# Patient Record
Sex: Male | Born: 1970 | Race: White | Hispanic: No | Marital: Married | Smoking: Never smoker
Health system: Southern US, Community
[De-identification: ages and names within clinical notes are randomized; demographics above are authoritative.]

## PROBLEM LIST (undated history)

## (undated) DIAGNOSIS — K219 Gastro-esophageal reflux disease without esophagitis: Secondary | ICD-10-CM

## (undated) DIAGNOSIS — F419 Anxiety disorder, unspecified: Secondary | ICD-10-CM

## (undated) DIAGNOSIS — R142 Eructation: Secondary | ICD-10-CM

## (undated) DIAGNOSIS — G4730 Sleep apnea, unspecified: Secondary | ICD-10-CM

## (undated) DIAGNOSIS — R079 Chest pain, unspecified: Secondary | ICD-10-CM

## (undated) DIAGNOSIS — F329 Major depressive disorder, single episode, unspecified: Secondary | ICD-10-CM

## (undated) DIAGNOSIS — Z9989 Dependence on other enabling machines and devices: Secondary | ICD-10-CM

## (undated) DIAGNOSIS — R109 Unspecified abdominal pain: Secondary | ICD-10-CM

## (undated) DIAGNOSIS — G4733 Obstructive sleep apnea (adult) (pediatric): Secondary | ICD-10-CM

## (undated) DIAGNOSIS — R12 Heartburn: Secondary | ICD-10-CM

## (undated) DIAGNOSIS — R140 Abdominal distension (gaseous): Secondary | ICD-10-CM

## (undated) DIAGNOSIS — Z9109 Other allergy status, other than to drugs and biological substances: Secondary | ICD-10-CM

## (undated) DIAGNOSIS — K649 Unspecified hemorrhoids: Secondary | ICD-10-CM

## (undated) DIAGNOSIS — G43909 Migraine, unspecified, not intractable, without status migrainosus: Secondary | ICD-10-CM

## (undated) DIAGNOSIS — T7840XA Allergy, unspecified, initial encounter: Secondary | ICD-10-CM

## (undated) DIAGNOSIS — F32A Depression, unspecified: Secondary | ICD-10-CM

## (undated) DIAGNOSIS — R197 Diarrhea, unspecified: Secondary | ICD-10-CM

## (undated) HISTORY — DX: Obstructive sleep apnea (adult) (pediatric): G47.33

## (undated) HISTORY — DX: Abdominal distension (gaseous): R14.0

## (undated) HISTORY — DX: Depression, unspecified: F32.A

## (undated) HISTORY — DX: Dependence on other enabling machines and devices: Z99.89

## (undated) HISTORY — DX: Migraine, unspecified, not intractable, without status migrainosus: G43.909

## (undated) HISTORY — DX: Chest pain, unspecified: R07.9

## (undated) HISTORY — DX: Unspecified abdominal pain: R10.9

## (undated) HISTORY — DX: Major depressive disorder, single episode, unspecified: F32.9

## (undated) HISTORY — DX: Heartburn: R12

## (undated) HISTORY — DX: Other allergy status, other than to drugs and biological substances: Z91.09

## (undated) HISTORY — DX: Sleep apnea, unspecified: G47.30

## (undated) HISTORY — DX: Unspecified hemorrhoids: K64.9

## (undated) HISTORY — DX: Allergy, unspecified, initial encounter: T78.40XA

## (undated) HISTORY — DX: Anxiety disorder, unspecified: F41.9

## (undated) HISTORY — DX: Diarrhea, unspecified: R19.7

## (undated) HISTORY — DX: Eructation: R14.2

## (undated) HISTORY — PX: OTHER SURGICAL HISTORY: SHX169

## (undated) HISTORY — DX: Gastro-esophageal reflux disease without esophagitis: K21.9

---
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
## 1999-03-26 ENCOUNTER — Encounter: Admission: RE | Admit: 1999-03-26 | Discharge: 1999-03-26 | Payer: Self-pay | Admitting: Allergy and Immunology

## 1999-03-26 ENCOUNTER — Encounter: Payer: Self-pay | Admitting: Allergy and Immunology

## 2002-01-27 ENCOUNTER — Encounter: Payer: Self-pay | Admitting: Internal Medicine

## 2002-01-27 ENCOUNTER — Encounter: Admission: RE | Admit: 2002-01-27 | Discharge: 2002-01-27 | Payer: Self-pay | Admitting: Internal Medicine

## 2004-06-09 HISTORY — PX: CARDIAC CATHETERIZATION: SHX172

## 2005-04-02 ENCOUNTER — Observation Stay (HOSPITAL_COMMUNITY): Admission: EM | Admit: 2005-04-02 | Discharge: 2005-04-03 | Payer: Self-pay | Admitting: Emergency Medicine

## 2005-04-08 ENCOUNTER — Ambulatory Visit (HOSPITAL_BASED_OUTPATIENT_CLINIC_OR_DEPARTMENT_OTHER): Admission: RE | Admit: 2005-04-08 | Discharge: 2005-04-08 | Payer: Self-pay | Admitting: Internal Medicine

## 2005-04-13 ENCOUNTER — Ambulatory Visit: Payer: Self-pay | Admitting: Internal Medicine

## 2005-07-21 ENCOUNTER — Encounter: Admission: RE | Admit: 2005-07-21 | Discharge: 2005-07-21 | Payer: Self-pay | Admitting: Internal Medicine

## 2006-06-09 HISTORY — PX: CARPAL TUNNEL RELEASE: SHX101

## 2006-07-17 ENCOUNTER — Ambulatory Visit (HOSPITAL_BASED_OUTPATIENT_CLINIC_OR_DEPARTMENT_OTHER): Admission: RE | Admit: 2006-07-17 | Discharge: 2006-07-17 | Payer: Self-pay | Admitting: Orthopedic Surgery

## 2007-04-19 ENCOUNTER — Ambulatory Visit: Payer: Self-pay | Admitting: Pulmonary Disease

## 2007-04-19 ENCOUNTER — Ambulatory Visit: Admission: RE | Admit: 2007-04-19 | Discharge: 2007-04-19 | Payer: Self-pay | Admitting: Pulmonary Disease

## 2007-04-28 ENCOUNTER — Telehealth (INDEPENDENT_AMBULATORY_CARE_PROVIDER_SITE_OTHER): Payer: Self-pay | Admitting: *Deleted

## 2007-04-30 DIAGNOSIS — J309 Allergic rhinitis, unspecified: Secondary | ICD-10-CM | POA: Insufficient documentation

## 2007-04-30 DIAGNOSIS — E785 Hyperlipidemia, unspecified: Secondary | ICD-10-CM | POA: Insufficient documentation

## 2007-04-30 DIAGNOSIS — G4733 Obstructive sleep apnea (adult) (pediatric): Secondary | ICD-10-CM | POA: Insufficient documentation

## 2007-04-30 DIAGNOSIS — G4710 Hypersomnia, unspecified: Secondary | ICD-10-CM | POA: Insufficient documentation

## 2007-04-30 DIAGNOSIS — R0602 Shortness of breath: Secondary | ICD-10-CM | POA: Insufficient documentation

## 2007-04-30 DIAGNOSIS — R519 Headache, unspecified: Secondary | ICD-10-CM | POA: Insufficient documentation

## 2007-04-30 DIAGNOSIS — R51 Headache: Secondary | ICD-10-CM | POA: Insufficient documentation

## 2007-05-03 ENCOUNTER — Ambulatory Visit: Payer: Self-pay | Admitting: Pulmonary Disease

## 2007-05-03 ENCOUNTER — Encounter: Payer: Self-pay | Admitting: Pulmonary Disease

## 2010-07-09 NOTE — Assessment & Plan Note (Signed)
   Vital Signs:  Patient Profile:   40 Years Old Male Weight:      327.50 pounds O2 Sat:      96 % Temp:     98.2 degrees F oral Pulse rate:   96 / minute BP sitting:   134 / 82  (right arm)  Vitals Entered By: Cyndia Diver (May 03, 2007 9:16 AM) Oxygen therapy Room Air                 Chief Complaint:  routine f-u/denied any new complaints.  History of Present Illness: pt returns for f/u of cough, upper airway sx.  He is much improved on  two times a day ppi and veramyst.  His cxr was unremarkable and pfts without abn except on fvl.  Pt had mild truncation of inspiratory limb on fvl.  Current Allergies: No known allergies       Physical Exam  General:     normal appearance and obese.      Impression & Recommendations:  Problem # 1:  ALLERGIC RHINITIS (ICD-477.9)  His updated medication list for this problem includes:    Zyrtec Allergy 10 Mg Tabs (Cetirizine hcl) .Marland Kitchen... Take by mouth once daily    Veramyst 27.5 Mcg/spray Susp (Fluticasone furoate) ..... Inhale 2 sprays in each nostril once daily pt is much improved on the above  regimen.  He should continue on this.  Problem # 2:  DYSPNEA (ICD-786.05) much better.  He has been to gym and able to walk on treadmill.  I suspect the truncation seen on fvl is secondary to ua dysfunction assoc with pnd and lpr. If he has persistent sx will need ua eval. per ent to r/o structural lesion.  I think lpr is playing a role here, but asked pt to decrease prilosec to one once daily for now.  f/u will be on as needed basis.  Medications Added to Medication List This Visit: 1)  Prilosec Otc 20 Mg Tbec (Omeprazole magnesium) .... Take two tabs by mouth  two times a day 2)  Veramyst 27.5 Mcg/spray Susp (Fluticasone furoate) .... Inhale 2 sprays in each nostril once daily     ]

## 2010-07-09 NOTE — Progress Notes (Signed)
  spoke with pt's family member.  family member stated pt already had appt scheduled for monday 05-03-07 @ 9:15 ---- Converted from flag ---- ---- 04/28/2007 2:05 PM, Barbaraann Share MD wrote: pt needs ov to discuss pfts and to see how things are going ------------------------------

## 2010-10-25 NOTE — Op Note (Signed)
NAME:  KONG, PACKETT                ACCOUNT NO.:  [id number]   MEDICAL RECORD NO.:  [id number]          PATIENT TYPE:  AMB   LOCATION:  DSC                          FACILITY:  [facility]   PHYSICIAN:  Katy Fitch. Sypher, M.D. DATE OF BIRTH:  12-Oct-1970   DATE OF PROCEDURE:  07/17/2006  DATE OF DISCHARGE:                               OPERATIVE REPORT   PREOPERATIVE DIAGNOSIS:  Bilateral carpal tunnel syndrome with positive  electrodiagnostic studies.   POSTOPERATIVE DIAGNOSIS:  Bilateral carpal tunnel syndrome with positive  electrodiagnostic studies.   OPERATION:  1. One release of left transverse carpal ligament.  2. Injection of right ulnar bursa.   OPERATING SURGEON:  Katy Fitch. Sypher, M.D.   ASSISTANT:  Jonni Sanger, P.A.   ANESTHESIA:  General by LMA, supervising anesthesiologist Dr.  Sampson Goon.   INDICATIONS:  Martin [name] is a 40 year old gentleman referred for  evaluation and management of bilateral hand numbness.  Clinical  examination revealed signs of probable carpal tunnel syndrome.  Electrodiagnostic studies completed by Laurier Nancy, M.D.  revealed significant bilateral carpal tunnel syndrome.   Due to a failure to respond to nonoperative measures, he is now brought  to the operating room for release of his left transverse carpal ligament  and for palliation will be injected into the right ulnar bursa pending  right carpal tunnel release surgery.   PROCEDURE:  Keenan Trefry was brought to the operating room and placed in  the supine position on the operating table.  Following induction of  general anesthesia by LMA technique, he was being stabilized when it was  noted that he may have had some leakage around his LMA.  Therefore, Dr.  Sampson Goon returned to the room and placed Mr. Ibarra under general  endotracheal anesthesia.   After his anesthesia needs were stabilized, the right arm and left arm  were prepped with Betadine soap and solution and  sterilely draped.   Following exsanguination of the left arm with an Esmarch bandage, the  arterial tourniquet on the proximal brachium was inflated to 250 mmHg.  The procedure on the left commenced with a short incision in the line of  ring finger in the palm.  The subcutaneous tissues were carefully  divided to reveal palmar fascia.  This was split longitudinally to  reveal the  common sensory branch of the median nerve.  These were  followed back to the transverse carpal ligament, which was gently  isolated from median nerve.  The transverse carpal ligament was released  along its ulnar border extending into the distal forearm.  This widely  opened the carpal canal.  The volar forearm fascia was likewise released  subcutaneously.   The contents of the carpal canal inspected.  No mass or other  predicaments were noted.  The wound was then repaired with intradermal 3-  0 Prolene suture.   A compressive dressing was applied with a volar plaster splint on the  left.  The tourniquet was released with immediate capillary refill to  the fingers and thumb.   Attention was then directed to the right hand.  The fingers were placed  in flexion and a 27-gauge needle was placed into the ulnar bursa ulnar  to the position of the median nerve.  A mixture of 1.5 mL of 1%  lidocaine and 1 mL of Depo-Medrol 40 mg/mL was injected into the ulnar  bursa without complication.  This was then treated with direct  compression for a few moments.   For aftercare Mr. Pulsifer is advised to elevate his left hand.  He will  use his hand for self-care.   He will return to [place] for follow-up in 1 week or sooner for any problems.   He is provided a prescription for Percocet 5 mg one p.o. q.4-6h. p.r.n.  pain, 20 tablets without refill.      Katy Fitch Sypher, M.D.  Electronically Signed     RVS/MEDQ  D:  07/17/2006  T:  07/17/2006  Job:  [id number]

## 2010-10-25 NOTE — Consult Note (Signed)
NAMERILAN, EILAND NO.:  [id number]   MEDICAL RECORD NO.:  [id number]          PATIENT TYPE:  EMS   LOCATION:  MAJO                         FACILITY:  [facility]   PHYSICIAN:  Meade Maw, M.D.    DATE OF BIRTH:  1971-01-29   DATE OF CONSULTATION:  04/02/2005  DATE OF DISCHARGE:                                   CONSULTATION   REASON FOR CONSULTATION:  Chest pain.   HISTORY:  Martin [name] is a very pleasant 40 year old male who presents with  intermittent episodes since Monday.  The chest pain is brought on with  exertion, described as a bandlike pain around his chest, left more than  right.  This is associated with dizziness and nausea.  There has been no  emesis, no diaphoresis  The chest pain will persist until he stops  his  activity.  He has not had any prior episodes of chest pain similar to this.  He does admit that he has panic attacks and GERD this pain is not similar to  his previous chest pain.  His coronary risk factors include male sex, strong  family history, sedentary lifestyle.  There is no history of tobacco,  diabetes or hypertension.   PAST MEDICAL HISTORY:  1.  Depression.  2.  Low back pain.  3.  Asthma.  4.  Allergies.  5.  Probable sleep apnea.   PAST SURGICAL HISTORY:  1.  Pilonidal cyst removal in 1988.  2.  Appendectomy in 1979.   CURRENT MEDICATIONS:  1.  Paxil 40 mg daily.  2.  Prilosec 10 mg daily.   ALLERGIES:  NO KNOWN DRUG ALLERGIES.   INPATIENT MEDICATIONS:  1.  Paxil 40 mg daily.  2.  Prilosec 10 mg daily.  3.  Nexium 40 mg daily.  4.  Aspirin 81 mg daily.  5.  Sublingual nitroglycerin p.r.n.   FAMILY HISTORY:  Mother is alive and well at 44 years old.  Father had  myocardial infarction at 37 years old.  Maternal grandfather with myocardial  infarction in his 32s.  He has two half siblings who had similar health  problems.   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  He has  a 90-year-old child.  Lives with his wife.  No history of  tobacco use.  Rare history of alcohol use.   REVIEW OF SYSTEMS:  He knows he has increased fatigue.  His wife notes that  he snores.  He has been poorly compliant with his diet.  He notes that he  has gained approximately 100 pounds since his high school years.  Review of  systems is otherwise negative.   PHYSICAL EXAMINATION:  GENERAL APPEARANCE:  A middle-age male in no acute  distress.  Currently pain-free.  VITAL SIGNS:  His weight in the office is 293 pounds, afebrile.  Blood  pressure is 130/80, heart rate 72.  HEENT:  Unremarkable.  NECK:  He has good carotid upstrokes, no carotid bruits.  PULMONARY:  Breath sounds are equal and clear to auscultation.  No use  of  accessory muscles.  CARDIOVASCULAR:  Normal S1, normal S2, regular rate and rhythm, no murmurs,  rubs, or gallops noted.  ABDOMEN:  Soft, benign and nontender.  EXTREMITIES:  No peripheral edema.  SKIN:  Warm and dry.  NEUROLOGIC:  Nonfocal.   ECG performed reveals a normal sinus rhythm, normal ECG.   LABORATORY DATA:  Currently pending.   IMPRESSION:  1.  Chest pain concerning for angina in a 40 year old male with risk factors      as noted above.  In that his angina is typical with exertion, will      proceed directly to left heart catheterization.  Risks, benefits, and      options were discussed with the patient.  He wishes to proceed with left      heart catheterization.  He will be started on Lovenox and aspirin at 325      mg daily.  2.  Depression.  He will continue with Zoloft at 40 mg daily.  3.  Gastroesophageal reflux disease.  He will continue with Prilosec at 10      mg daily.  4.  Probable sleep apnea.  Will schedule him for a sleep study in the      outpatient basis.      Meade Maw, M.D.  Electronically Signed     HP/MEDQ  D:  04/02/2005  T:  04/03/2005  Job:  [id number]   cc:   Candyce Churn, M.D.  Fax: [phone]

## 2010-10-25 NOTE — Procedures (Signed)
NAME:  Martin [name], Martin [name]                ACCOUNT NO.:  [id number]   MEDICAL RECORD NO.:  [id number]          PATIENT TYPE:  OUT   LOCATION:  SLEEP CENTER                 [facility]AC[facility]   PHYSICIAN:  Clinton D. Maple Hudson, M.D. DATE OF BIRTH:  05-Oct-1970   DATE OF STUDY:  04/08/2005                              NOCTURNAL POLYSOMNOGRAM   REFERRING PHYSICIAN:  Dr. Ladell Pier.   DATE OF STUDY:  April 08, 2005.   INDICATION FOR STUDY:  Hypersomnia with sleep apnea.   EPWORTH SLEEPINESS SCORE:  11/24.   BMI:  43.   WEIGHT:  288 pounds.   Home medications citalopram 40 milligrams daily.   SLEEP ARCHITECTURE:  Total sleep time 347 minutes with sleep efficiency 68%.  Stage I 12%, stage II 70%, stages III and IV 15%, REM 3% of total sleep  time. Sleep latency 48 minutes, REM latency 292 minutes, awake after sleep  onset 122 minutes, arousal index increased at 50. No bedtime medication  reported.   RESPIRATORY DATA:  Split study protocol. Apnea/hypopnea index (AHI, RDI)  150.4 obstructive events per hour indicating very severe obstructive sleep  apnea/hypopnea syndrome before C-PAP. This included 238 obstructive apneas,  7 central apneas and 17 hypopneas before C-PAP. Events were not positional.  REM AHI 0 per hour. C-PAP titration did not lead to clear control of events.  The technician referred to severe nasal congestion which contributed to  difficulty with C-PAP titration. 13 CWP was maintained for 21 minutes  holding an apnea/hypopnea index of 0. A full-face ultra mirage mask was used  with heated humidifier.   OXYGEN DATA:  Moderate to loud snoring with oxygen desaturation to a nadir  of 73% before C-PAP. After C-PAP control, saturation held 92-96% during best  C-PAP interval.   CARDIAC DATA:  Normal sinus rhythm.   MOVEMENT/PARASOMNIA:  Occasional leg jerk with little effect on sleep.   IMPRESSION/RECOMMENDATIONS:  1.  Very severe obstructive sleep apnea/hypopnea syndrome,  AHI 150.4 per      hour with moderate to loud snoring and oxygen desaturation to 73%.  2.  Significant nasal congestion which limited ability to titrate C-PAP.  3.  C-PAP titration to recommended initial trial at 213 CWP, AHI 0 per hour.      A large full-face ultra mirage mask was used with heated humidifier.      Clinton D. Maple Hudson, M.D.  Diplomate, Biomedical engineer of Sleep Medicine  Electronically Signed     CDY/MEDQ  D:  04/13/2005 14:59:18  T:  04/14/2005 00:50:46  Job:  [id number]

## 2010-10-25 NOTE — H&P (Signed)
Martin [name], Martin [name]                ACCOUNT NO.:  [id number]   MEDICAL RECORD NO.:  [id number]          PATIENT TYPE:  INP   LOCATION:  2012                         FACILITY:  [facility]   PHYSICIAN:  Candyce Churn, M.D.DATE OF BIRTH:  Nov 10, 1970   DATE OF ADMISSION:  04/02/2005  DATE OF DISCHARGE:                                HISTORY & PHYSICAL   This is a 40 year old, obese, white male with a chief complaint of  exertional band-like chest pain, left greater than right side, associated  with dizziness and nausea, no sweats.  Episodes lasting 1-5 minutes and  stopping if he stops the activity.  He does have a family history of heart  disease in his paternal father and father.  The patient has a history of  elevated cholesterol.  No exercise routinely and no prudent diet.   HISTORY OF PRESENT ILLNESS:  For two days he has had exertional crescendo  chest pain that is band-like across the chest associated with dizziness and  nausea but not sweats.  The pain radiates to the left arm and hand and lasts  1-5 minutes.  The pain goes away when he stops his exertional activity.   PAST MEDICAL HISTORY:  1.  Depression.  2.  Back pain.  3.  Asthma.  4.  Allergies.  5.  Sleep abnormality that sounds like sleep apnea.   PAST SURGICAL HISTORY:  1.  Pilonidal cyst in 1988.  2.  Appendectomy in 1979.   He has no known drug allergies.   MEDICATIONS:  1.  Citalopram 40 mg one a day.  2.  Prilosec 10 mg one a day.   FAMILY HISTORY:  Mother is alive and well at age 37.  His father had a heart  attack but is still alive at age 10.  His paternal grandfather died of a  heart attack in his 22s.  He has a half brother and half sister with no  health problems.   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  He does not smoke.  He drinks occasional alcohol.   PHYSICAL EXAMINATION:  GENERAL APPEARANCE:  Alert and no distress.  VITAL SIGNS:   Weight 292 pounds, temperature 99.2, pulse 72, blood pressure  130/80.  HEENT:  Normocephalic, atraumatic.  CHEST:  Lungs are clear.  HEART:  Regular rate and rhythm without murmurs, gallops, rubs, clicks.  No  S3 or S4 with normal S1 and S2.  ABDOMEN:  Benign.  EXTREMITIES:  No clubbing, cyanosis, or edema.  SKIN:  Intact.  GU:  Normal penis.  RECTAL:  Deferred.   LABORATORY:  EKG unremarkable.   IMPRESSION:  Chest pain rule out myocardial infarction.   PLAN:  Admit to rule out MI.  __________  consult with Dr. Candyce Churn.      Lavinia Sharps, N.P.      Candyce Churn, M.D.  Electronically Signed    MAP/MEDQ  D:  04/02/2005  T:  04/02/2005  Job:  [id number]

## 2010-10-25 NOTE — Discharge Summary (Signed)
Martin [name], Martin [name] NO.:  [id number]   MEDICAL RECORD NO.:  [id number]          PATIENT TYPE:  INP   LOCATION:  2012                         FACILITY:  [facility]   PHYSICIAN:  Candyce Churn, M.D.DATE OF BIRTH:  [birth year]   DATE OF ADMISSION:  04/02/2005  DATE OF DISCHARGE:  04/03/2005                                 DISCHARGE SUMMARY   DISCHARGE DIAGNOSIS:  1.  Chest pain, non-cardiac.  2.  Depression.  3.  History of apnea.  4.  Asthma.  5.  Allergies.  6.  Sleep abnormality that is possibly sleep apnea.   DISCHARGE MEDICATIONS:  1.  Citalopram 40 mg daily.  2.  Prilosec 10 mg daily.   CONSULTATIONS:  Meade Maw, M.D., cardiology   PROCEDURES:  Cardiac catheterization performed on April 02, 2005, findings  revealed normal coronary arteries and an ejection fraction of 60-65%.   DISPOSITION:  The patient was discharged home after a 24 hour admission with  chest pain resolved not felt to be cardiac.  It sounds like it could  possibly be secondary to GERD.  He will be discharged home on Protonix 40 mg  daily and be followed up back in the office in 1-2 weeks.   DISCHARGE LABORATORY DATA:  From April 03, 2005, white count 8,100,  hemoglobin 15.4, platelet count 250,000, with normal differential.  PT 13.2  seconds, PTT 30 seconds.  Electrolytes on October 26 showed sodium 140,  potassium 3.7, chloride 106, bicarb 29, glucose 113, BUN 14, creatinine 1.3,  calcium 9.3.  LFTs were normal on April 02, 2005.  CK on admission was 300  with CK MB 2.5, relative index 0.8, and troponin less than 0.01.  Subsequent  CKs were 242 and 211 respectively at 8 hour intervals after the initial CK.  The troponins remained negative at 0.01 x 3.   HOSPITAL COURSE:  The patient was admitted on April 02, 2005, complaining  of chest pain that was band like in nature and exertional, left greater than  right, associated with dizziness and nausea, but no sweat.  The  episode  would last 1-5 minutes and would go away if he stopped physical activity.  He had a very strong family history of heart disease in his paternal father  - father had a heart attack but is still alive at age 49, maternal father  had heart  attack in 109s.  He is a relatively inactive male with no prudent diet.  He  also has a history of elevated cholesterol.  The patient's cardiac  catheterization was within normal limits and he was discharged home for  further follow up in the office to further assess chest pain, but it is felt  this was likely secondary to GERD.      Candyce Churn, M.D.  Electronically Signed     RNG/MEDQ  D:  05/29/2005  T:  05/30/2005  Job:  [id number]

## 2010-10-25 NOTE — Cardiovascular Report (Signed)
NAMEMENACHEM, [name] NO.:  [id number]   MEDICAL RECORD NO.:  [id number]          PATIENT TYPE:  INP   LOCATION:  2012                         FACILITY:  [facility]   PHYSICIAN:  Meade Maw, M.D.    DATE OF BIRTH:  [birth year]   DATE OF PROCEDURE:  04/03/2005  DATE OF DISCHARGE:                              CARDIAC CATHETERIZATION   REFERRING PHYSICIAN:  Johnella Moloney, M.D.   INDICATIONS FOR PROCEDURE:  Chest pain consistent with angina.   PROCEDURE:  After obtaining written informed consent, the patient was  brought to the cardiac catheterization lab in a post-absorptive state.  Preop sedation was achieved using Versed 10 mg IV.  Zofran 4 mg IV was given  for nausea.  The right groin was prepped and draped in the usual sterile  fashion.  Local anesthesia was achieved using 1% Xylocaine.  A 6-French  hemostasis sheath was placed into the right femoral artery using the  modified Seldinger technique.  Selective coronary angiography was performed  using a JL-3.5 and a JR4.  Multiple views were obtained.  All catheter  exchanges were made over a guidewire.  Single-plane ventriculogram was  performed in the RAO position using a 6-French pigtail curved catheter.   FINDINGS:  The aortic pressure is 108/18.  LV pressure was 106/78.  The EDP  is 20.   SINGLE-PLANE VENTRICULOGRAM:  Single-plane ventriculogram revealed normal  wall motion.  Ejection fraction of 60% to 65% was noted.  There was no  significant mitral regurgitation noted.   CORONARY ANGIOGRAPHY:  The left main coronary artery bifurcates into the  left anterior descending and circumflex vessel.  Left main coronary artery  was long.  There was no disease noted in the left main coronary artery.   Left anterior descending:  The left anterior descending gives rise to a  small D1, moderate D2 and goes on to end as an apical branch.  There is no  disease noted in the left anterior descending or its branches.   Circumflex vessel:  The circumflex vessel is a moderate-size vessel that  gives rise to a trivial OM-1, large OM-2 and ends as a posterolateral  branch.  There is no disease noted in circumflex or its branches.   Right coronary artery:  The right coronary artery is a large dominant artery  and also provides circulation to the apical region.  It gives rise to 2 RV  marginals, a moderate PDA and a large PL branch.  There is no disease noted  in the right coronary artery or its branches.   FINAL IMPRESSION:  1.  Normal coronary angiography.  2.  Normal single-plane ventriculogram.   RECOMMENDATIONS:  Consider other etiologies for his chest pain.      Meade Maw, M.D.  Electronically Signed     HP/MEDQ  D:  04/03/2005  T:  04/03/2005  Job:  [id number]

## 2011-03-25 ENCOUNTER — Encounter: Payer: Self-pay | Admitting: Family Medicine

## 2011-03-26 ENCOUNTER — Encounter: Payer: Self-pay | Admitting: Family Medicine

## 2011-03-26 ENCOUNTER — Ambulatory Visit (INDEPENDENT_AMBULATORY_CARE_PROVIDER_SITE_OTHER): Payer: BC Managed Care – PPO | Admitting: Family Medicine

## 2011-03-26 DIAGNOSIS — J302 Other seasonal allergic rhinitis: Secondary | ICD-10-CM

## 2011-03-26 DIAGNOSIS — Z23 Encounter for immunization: Secondary | ICD-10-CM

## 2011-03-26 DIAGNOSIS — G4730 Sleep apnea, unspecified: Secondary | ICD-10-CM

## 2011-03-26 DIAGNOSIS — J309 Allergic rhinitis, unspecified: Secondary | ICD-10-CM

## 2011-03-26 DIAGNOSIS — Z9109 Other allergy status, other than to drugs and biological substances: Secondary | ICD-10-CM

## 2011-03-26 DIAGNOSIS — F418 Other specified anxiety disorders: Secondary | ICD-10-CM | POA: Insufficient documentation

## 2011-03-26 DIAGNOSIS — F341 Dysthymic disorder: Secondary | ICD-10-CM

## 2011-03-26 DIAGNOSIS — G4733 Obstructive sleep apnea (adult) (pediatric): Secondary | ICD-10-CM

## 2011-03-26 MED ORDER — TRIAMCINOLONE ACETONIDE(NASAL) 55 MCG/ACT NA INHA: 2.0000 | Freq: Every day | NASAL | Status: DC

## 2011-03-26 MED ORDER — MONTELUKAST SODIUM 10 MG PO TABS: 10.0000 mg | ORAL_TABLET | Freq: Every day | ORAL | Status: DC

## 2011-03-26 NOTE — Assessment & Plan Note (Signed)
con't zyrtec Add singulair and nasocort Refer to pulm

## 2011-03-26 NOTE — Progress Notes (Signed)
  Subjective:    Patient ID: Martin [name], male    DOB: 1971-06-07, 40 y.o.   MRN: [id number]  HPI Pt here to establish.  He is having increasing problems with allergies --he takes zyrtec daily and benadryl prn.  He is unable to be outside for any length of time without having his allergies act up.  He also c/o difficulty concentrating for a few month.  He is unable to finish tasks or focus on anything or even finish a conversation without changing the subject.      Review of Systems    as above Objective:   Physical Exam  Constitutional: He is oriented to person, place, and time. He appears well-developed and well-nourished.  HENT:  Head: Normocephalic and atraumatic.  Right Ear: External ear normal.  Left Ear: External ear normal.  Nose: Nose normal.  Mouth/Throat: Oropharynx is clear and moist. No oropharyngeal exudate.  Neck: Normal range of motion. Neck supple.  Cardiovascular: Normal rate, regular rhythm and normal heart sounds.   No murmur heard. Pulmonary/Chest: Effort normal and breath sounds normal. No respiratory distress. He has no wheezes. He has no rales.  Lymphadenopathy:    He has no cervical adenopathy.  Neurological: He is alert and oriented to person, place, and time.  Psychiatric: He has a normal mood and affect. His behavior is normal. Judgment and thought content normal.          Assessment & Plan:

## 2011-03-26 NOTE — Assessment & Plan Note (Addendum)
Refer to pulm for f/u and eval con't cpap

## 2011-03-26 NOTE — Patient Instructions (Signed)
Allergies, Generic Allergies may happen from anything your body is sensitive to. This may be food, medicines, pollens, chemicals, and nearly anything around you in everyday life that produces allergens. An allergen is anything that causes an allergy producing substance. Heredity is often a factor in causing these problems. This means you may have some of the same allergies as your parents. Food allergies happen in all age groups. Food allergies are some of the most severe and life threatening. Some common food allergies are cow's milk, seafood, eggs, nuts, wheat, and soybeans. SYMPTOMS  Swelling around the mouth.   An itchy red rash or hives.   Vomiting or diarrhea.   Difficulty breathing.  SEVERE ALLERGIC REACTIONS ARE LIFE-THREATENING.  This reaction is called anaphylaxis. It can cause the mouth and throat to swell and cause difficulty with breathing and swallowing. In severe reactions only a trace amount of food (for example, peanut oil in a salad) may cause death within seconds. Seasonal allergies occur in all age groups. These are seasonal because they usually occur during the same season every year. They may be a reaction to molds, grass pollens, or tree pollens. Other causes of problems are house dust mite allergens, pet dander, and mold spores. The symptoms often consist of nasal congestion, a runny itchy nose associated with sneezing, and tearing itchy eyes. There is often an associated itching of the mouth and ears. The problems happen when you come in contact with pollens and other allergens. Allergens are the particles in the air that the body reacts to with an allergic reaction. This causes you to release allergic antibodies. Through a chain of events, these eventually cause you to release histamine into the blood stream. Although it is meant to be protective to the body, it is this release that causes your discomfort. This is why you were given anti-histamines to feel better. If you are  unable to pinpoint the offending allergen, it may be determined by skin or blood testing. Allergies cannot be cured but can be controlled with medicine. Hay fever is a collection of all or some of the seasonal allergy problems. It may often be treated with simple over-the-counter medicine such as diphenhydramine. Take medicine as directed. Do not drink alcohol or drive while taking this medicine. Check with your caregiver or package insert for child dosages. If these medicines are not effective, there are many new medicines your caregiver can prescribe. Stronger medicine such as nasal spray, eye drops, and corticosteroids may be used if the first things you try do not work well. Other treatments such as immunotherapy or desensitizing injections can be used if all else fails. Follow up with your caregiver if problems continue. These seasonal allergies are usually not life threatening. They are generally more of a nuisance that can often be handled using medicine. HOME CARE INSTRUCTIONS  If unsure what causes a reaction, keep a diary of foods eaten and symptoms that follow. Avoid foods that cause reactions.   If hives or rash are present:   Take medicine as directed.   You may use an over-the-counter antihistamine (diphenhydramine) for hives and itching as needed.   Apply cold compresses (cloths) to the skin or take baths in cool water. Avoid hot baths or showers. Heat will make a rash and itching worse.   If you are severely allergic:   Following a treatment for a severe reaction, hospitalization is often required for closer follow-up.   Wear a medic-alert bracelet or necklace stating the allergy.     You and your family must learn how to give adrenaline or use an anaphylaxis kit.   If you have had a severe reaction, always carry your anaphylaxis kit or EpiPen with you. Use this medicine as directed by your caregiver if a severe reaction is occurring. Failure to do so could have a fatal outcome.   SEE YOUR CAREGIVER IF:  You suspect a food allergy. Symptoms generally happen within 30 minutes of eating a food.   Your symptoms have not gone away within 2 days or are getting worse.   You develop new symptoms.   You want to retest yourself or your child with a food or drink you think causes an allergic reaction. Never do this if an anaphylactic reaction to that food or drink has happened before. Only do this under the care of a caregiver.  SEEK IMMEDIATE MEDICAL CARE IF:  You have difficulty breathing, are wheezing, or have a tight feeling in your chest or throat.   You have a swollen mouth, or you have hives, swelling, or itching all over your body.   You have had a severe reaction that has responded to your anaphylaxis kit or an EpiPen. These reactions may return when the medicine has worn off. These reactions should be considered life threatening.  MAKE SURE YOU:   Understand these instructions.   Will watch your condition.   Will get help right away if you are not doing well or get worse.  Document Released: 08/19/2002 Document Re-Released: 06/17/2009 [employer] Patient Information 2011 [employer]. 

## 2011-03-26 NOTE — Assessment & Plan Note (Signed)
con't celexa  ?

## 2011-03-28 ENCOUNTER — Telehealth: Payer: Self-pay | Admitting: *Deleted

## 2011-03-28 NOTE — Telephone Encounter (Signed)
Prior Auth approved 03-26-11 until 12-19-13, Pharmacy notified via fax, approval letter scan to chart.

## 2011-04-18 ENCOUNTER — Institutional Professional Consult (permissible substitution): Payer: BC Managed Care – PPO | Admitting: Pulmonary Disease

## 2011-07-04 ENCOUNTER — Other Ambulatory Visit: Payer: Self-pay | Admitting: Family Medicine

## 2011-07-04 DIAGNOSIS — Z9109 Other allergy status, other than to drugs and biological substances: Secondary | ICD-10-CM

## 2011-07-04 MED ORDER — MONTELUKAST SODIUM 10 MG PO TABS: 10.0000 mg | ORAL_TABLET | Freq: Every day | ORAL | Status: DC

## 2011-07-04 NOTE — Telephone Encounter (Signed)
Faxed.   KP 

## 2011-09-10 ENCOUNTER — Telehealth: Payer: Self-pay | Admitting: Family Medicine

## 2011-09-10 MED ORDER — CITALOPRAM HYDROBROMIDE 40 MG PO TABS: 40.0000 mg | ORAL_TABLET | Freq: Every day | ORAL | Status: DC

## 2011-09-10 NOTE — Telephone Encounter (Signed)
Rx Faxed and VM has been left making the patient aware.     KP

## 2011-09-10 NOTE — Telephone Encounter (Signed)
Patient states he is overdue for CPE. He scheduled first available on 11-07-11. He needs refills on his citalopram to get him by until his appt. 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 Laury [name] will be out of the office during that time. Patient wanted to make Dr. Laury [name] aware of the situation.

## 2011-10-10 ENCOUNTER — Ambulatory Visit: Payer: BC Managed Care – PPO | Admitting: Family Medicine

## 2011-10-23 ENCOUNTER — Other Ambulatory Visit: Payer: Self-pay | Admitting: Family Medicine

## 2011-11-07 ENCOUNTER — Encounter: Payer: BC Managed Care – PPO | Admitting: Family Medicine

## 2011-11-14 ENCOUNTER — Encounter: Payer: Self-pay | Admitting: Family Medicine

## 2011-11-14 ENCOUNTER — Ambulatory Visit (INDEPENDENT_AMBULATORY_CARE_PROVIDER_SITE_OTHER): Payer: BC Managed Care – PPO | Admitting: Family Medicine

## 2011-11-14 VITALS — BP 117/74 | HR 96 | Temp 99.1°F | Ht 68.0 in | Wt 356.2 lb

## 2011-11-14 DIAGNOSIS — F329 Major depressive disorder, single episode, unspecified: Secondary | ICD-10-CM

## 2011-11-14 DIAGNOSIS — J309 Allergic rhinitis, unspecified: Secondary | ICD-10-CM

## 2011-11-14 DIAGNOSIS — J302 Other seasonal allergic rhinitis: Secondary | ICD-10-CM

## 2011-11-14 DIAGNOSIS — Z0000 Encounter for general adult medical examination without abnormal findings: Secondary | ICD-10-CM

## 2011-11-14 DIAGNOSIS — F32A Depression, unspecified: Secondary | ICD-10-CM

## 2011-11-14 DIAGNOSIS — E785 Hyperlipidemia, unspecified: Secondary | ICD-10-CM

## 2011-11-14 MED ORDER — CITALOPRAM HYDROBROMIDE 40 MG PO TABS: 40.0000 mg | ORAL_TABLET | Freq: Every day | ORAL | Status: DC

## 2011-11-14 MED ORDER — AZELASTINE-FLUTICASONE 137-50 MCG/ACT NA SUSP: 1.0000 | Freq: Two times a day (BID) | NASAL | Status: DC

## 2011-11-14 NOTE — Assessment & Plan Note (Signed)
Con't diet and exercise  

## 2011-11-14 NOTE — Assessment & Plan Note (Signed)
Check labs 

## 2011-11-14 NOTE — Patient Instructions (Signed)
Preventive Care for Adults, Male A healthy lifestyle and preventative care can promote health and wellness. Preventative health guidelines for men include the following key practices:  A routine yearly physical is a good way to check with your caregiver about your health and preventative screening. It is a chance to share any concerns and updates on your health, and to receive a thorough exam.   Visit your dentist for a routine exam and preventative care every 6 months. Brush your teeth twice a day and floss once a day. Good oral hygiene prevents tooth decay and gum disease.   The frequency of eye exams is based on your age, health, family medical history, use of contact lenses, and other factors. Follow your caregiver's recommendations for frequency of eye exams.   Eat a healthy diet. Foods like vegetables, fruits, whole grains, low-fat dairy products, and lean protein foods contain the nutrients you need without too many calories. Decrease your intake of foods high in solid fats, added sugars, and salt. Eat the right amount of calories for you.Get information about a proper diet from your caregiver, if necessary.   Regular physical exercise is one of the most important things you can do for your health. Most adults should get at least 150 minutes of moderate-intensity exercise (any activity that increases your heart rate and causes you to sweat) each week. In addition, most adults need muscle-strengthening exercises on 2 or more days a week.   Maintain a healthy weight. The body mass index (BMI) is a screening tool to identify possible weight problems. It provides an estimate of body fat based on height and weight. Your caregiver can help determine your BMI, and can help you achieve or maintain a healthy weight.For adults 20 years and older:   A BMI below 18.5 is considered underweight.   A BMI of 18.5 to 24.9 is normal.   A BMI of 25 to 29.9 is considered overweight.   A BMI of 30 and above  is considered obese.   Maintain normal blood lipids and cholesterol levels by exercising and minimizing your intake of saturated fat. Eat a balanced diet with plenty of fruit and vegetables. Blood tests for lipids and cholesterol should begin at age 20 and be repeated every 5 years. If your lipid or cholesterol levels are high, you are over 50, or you are a high risk for heart disease, you may need your cholesterol levels checked more frequently.Ongoing high lipid and cholesterol levels should be treated with medicines if diet and exercise are not effective.   If you smoke, find out from your caregiver how to quit. If you do not use tobacco, do not start.   If you choose to drink alcohol, do not exceed 2 drinks per day. One drink is considered to be 12 ounces (355 mL) of beer, 5 ounces (148 mL) of wine, or 1.5 ounces (44 mL) of liquor.   Avoid use of street drugs. Do not share needles with anyone. Ask for help if you need support or instructions about stopping the use of drugs.   High blood pressure causes heart disease and increases the risk of stroke. Your blood pressure should be checked at least every 1 to 2 years. Ongoing high blood pressure should be treated with medicines, if weight loss and exercise are not effective.   If you are 45 to 42 years old, ask your caregiver if you should take aspirin to prevent heart disease.   Diabetes screening involves taking a blood   sample to check your fasting blood sugar level. This should be done once every 3 years, after age 45, if you are within normal weight and without risk factors for diabetes. Testing should be considered at a younger age or be carried out more frequently if you are overweight and have at least 1 risk factor for diabetes.   Colorectal cancer can be detected and often prevented. Most routine colorectal cancer screening begins at the age of 50 and continues through age 75. However, your caregiver may recommend screening at an earlier  age if you have risk factors for colon cancer. On a yearly basis, your caregiver may provide home test kits to check for hidden blood in the stool. Use of a small camera at the end of a tube, to directly examine the colon (sigmoidoscopy or colonoscopy), can detect the earliest forms of colorectal cancer. Talk to your caregiver about this at age 50, when routine screening begins. Direct examination of the colon should be repeated every 5 to 10 years through age 75, unless early forms of pre-cancerous polyps or small growths are found.   Hepatitis C blood testing is recommended for all people born from 1945 through 1965 and any individual with known risks for hepatitis C.   Practice safe sex. Use condoms and avoid high-risk sexual practices to reduce the spread of sexually transmitted infections (STIs). STIs include gonorrhea, chlamydia, syphilis, trichomonas, herpes, HPV, and human immunodeficiency virus (HIV). Herpes, HIV, and HPV are viral illnesses that have no cure. They can result in disability, cancer, and death.   A one-time screening for abdominal aortic aneurysm (AAA) and surgical repair of large AAAs by sound wave imaging (ultrasonography) is recommended for ages 65 to 75 years who are current or former smokers.   Healthy men should no longer receive prostate-specific antigen (PSA) blood tests as part of routine cancer screening. Consult with your caregiver about prostate cancer screening.   Testicular cancer screening is not recommended for adult males who have no symptoms. Screening includes self-exam, caregiver exam, and other screening tests. Consult with your caregiver about any symptoms you have or any concerns you have about testicular cancer.   Use sunscreen with skin protection factor (SPF) of 30 or more. Apply sunscreen liberally and repeatedly throughout the day. You should seek shade when your shadow is shorter than you. Protect yourself by wearing long sleeves, pants, a  wide-brimmed hat, and sunglasses year round, whenever you are outdoors.   Once a month, do a whole body skin exam, using a mirror to look at the skin on your back. Notify your caregiver of new moles, moles that have irregular borders, moles that are larger than a pencil eraser, or moles that have changed in shape or color.   Stay current with required immunizations.   Influenza. You need a dose every fall (or winter). The composition of the flu vaccine changes each year, so being vaccinated once is not enough.   Pneumococcal polysaccharide. You need 1 to 2 doses if you smoke cigarettes or if you have certain chronic medical conditions. You need 1 dose at age 65 (or older) if you have never been vaccinated.   Tetanus, diphtheria, pertussis (Tdap, Td). Get 1 dose of Tdap vaccine if you are younger than age 65 years, are over 65 and have contact with an infant, are a healthcare worker, or simply want to be protected from whooping cough. After that, you need a Td booster dose every 10 years. Consult your caregiver if   you have not had at least 3 tetanus and diphtheria-containing shots sometime in your life or have a deep or dirty wound.   HPV. This vaccine is recommended for males 13 through 42 years of age. This vaccine may be given to men 22 through 42 years of age who have not completed the 3 dose series. It is recommended for men through age 26 who have sex with men or whose immune system is weakened because of HIV infection, other illness, or medications. The vaccine is given in 3 doses over 6 months.   Measles, mumps, rubella (MMR). You need at least 1 dose of MMR if you were born in 1957 or later. You may also need a 2nd dose.   Meningococcal. If you are age 19 to 21 years and a first-year college student living in a residence hall, or have one of several medical conditions, you need to get vaccinated against meningococcal disease. You may also need additional booster doses.   Zoster (shingles).  If you are age 60 years or older, you should get this vaccine.   Varicella (chickenpox). If you have never had chickenpox or you were vaccinated but received only 1 dose, talk to your caregiver to find out if you need this vaccine.   Hepatitis A. You need this vaccine if you have a specific risk factor for hepatitis A virus infection, or you simply wish to be protected from this disease. The vaccine is usually given as 2 doses, 6 to 18 months apart.   Hepatitis B. You need this vaccine if you have a specific risk factor for hepatitis B virus infection or you simply wish to be protected from this disease. The vaccine is given in 3 doses, usually over 6 months.  Preventative Service / Frequency Ages 19 to 39  Blood pressure check.** / Every 1 to 2 years.   Lipid and cholesterol check.** / Every 5 years beginning at age 20.   Hepatitis C blood test.** / For any individual with known risks for hepatitis C.   Skin self-exam. / Monthly.   Influenza immunization.** / Every year.   Pneumococcal polysaccharide immunization.** / 1 to 2 doses if you smoke cigarettes or if you have certain chronic medical conditions.   Tetanus, diphtheria, pertussis (Tdap,Td) immunization. / A one-time dose of Tdap vaccine. After that, you need a Td booster dose every 10 years.   HPV immunization. / 3 doses over 6 months, if 26 and younger.   Measles, mumps, rubella (MMR) immunization. / You need at least 1 dose of MMR if you were born in 1957 or later. You may also need a 2nd dose.   Meningococcal immunization. / 1 dose if you are age 19 to 21 years and a first-year college student living in a residence hall, or have one of several medical conditions, you need to get vaccinated against meningococcal disease. You may also need additional booster doses.   Varicella immunization.** / Consult your caregiver.   Hepatitis A immunization.** / Consult your caregiver. 2 doses, 6 to 18 months apart.   Hepatitis B  immunization.** / Consult your caregiver. 3 doses usually over 6 months.  Ages 40 to 64  Blood pressure check.** / Every 1 to 2 years.   Lipid and cholesterol check.** / Every 5 years beginning at age 20.   Fecal occult blood test (FOBT) of stool. / Every year beginning at age 50 and continuing until age 75. You may not have to do this test if   you get colonoscopy every 10 years.   Flexible sigmoidoscopy** or colonoscopy.** / Every 5 years for a flexible sigmoidoscopy or every 10 years for a colonoscopy beginning at age 50 and continuing until age 75.   Hepatitis C blood test.** / For all people born from 1945 through 1965 and any individual with known risks for hepatitis C.   Skin self-exam. / Monthly.   Influenza immunization.** / Every year.   Pneumococcal polysaccharide immunization.** / 1 to 2 doses if you smoke cigarettes or if you have certain chronic medical conditions.   Tetanus, diphtheria, pertussis (Tdap/Td) immunization.** / A one-time dose of Tdap vaccine. After that, you need a Td booster dose every 10 years.   Measles, mumps, rubella (MMR) immunization. / You need at least 1 dose of MMR if you were born in 1957 or later. You may also need a 2nd dose.   Varicella immunization.**/ Consult your caregiver.   Meningococcal immunization.** / Consult your caregiver.   Hepatitis A immunization.** / Consult your caregiver. 2 doses, 6 to 18 months apart.   Hepatitis B immunization.** / Consult your caregiver. 3 doses, usually over 6 months.  Ages 65 and over  Blood pressure check.** / Every 1 to 2 years.   Lipid and cholesterol check.**/ Every 5 years beginning at age 20.   Fecal occult blood test (FOBT) of stool. / Every year beginning at age 50 and continuing until age 75. You may not have to do this test if you get colonoscopy every 10 years.   Flexible sigmoidoscopy** or colonoscopy.** / Every 5 years for a flexible sigmoidoscopy or every 10 years for a colonoscopy  beginning at age 50 and continuing until age 75.   Hepatitis C blood test.** / For all people born from 1945 through 1965 and any individual with known risks for hepatitis C.   Abdominal aortic aneurysm (AAA) screening.** / A one-time screening for ages 65 to 75 years who are current or former smokers.   Skin self-exam. / Monthly.   Influenza immunization.** / Every year.   Pneumococcal polysaccharide immunization.** / 1 dose at age 65 (or older) if you have never been vaccinated.   Tetanus, diphtheria, pertussis (Tdap, Td) immunization. / A one-time dose of Tdap vaccine if you are over 65 and have contact with an infant, are a healthcare worker, or simply want to be protected from whooping cough. After that, you need a Td booster dose every 10 years.   Varicella immunization. ** / Consult your caregiver.   Meningococcal immunization.** / Consult your caregiver.   Hepatitis A immunization. ** / Consult your caregiver. 2 doses, 6 to 18 months apart.   Hepatitis B immunization.** / Check with your caregiver. 3 doses, usually over 6 months.  **Family history and personal history of risk and conditions may change your caregiver's recommendations. Document Released: 07/22/2001 Document Revised: 05/15/2011 Document Reviewed: 10/21/2010 [employer] Patient Information 2012 [employer]. 

## 2011-11-14 NOTE — Progress Notes (Signed)
Subjective:    Patient ID: Martin [name], male    DOB: 1971-04-25, 41 y.o.   MRN: [id number]  HPI Pt here for cpe and labs.  No complaints.  Review of Systems Review of Systems  Constitutional: Negative for activity change, appetite change and fatigue.  HENT: Negative for hearing loss, congestion, tinnitus and ear discharge.  dentist q42m Eyes: Negative for visual disturbance (see optho q1y -- vision corrected to 20/20 with glasses).  Respiratory: Negative for cough, chest tightness and shortness of breath.   Cardiovascular: Negative for chest pain, palpitations and leg swelling.  Gastrointestinal: Negative for abdominal pain, diarrhea, constipation and abdominal distention.  Genitourinary: Negative for urgency, frequency, decreased urine volume and difficulty urinating.  Musculoskeletal: Negative for back pain, arthralgias and gait problem.  Skin: Negative for color change, pallor and rash.  Neurological: Negative for dizziness, light-headedness, numbness and headaches.  Hematological: Negative for adenopathy. Does not bruise/bleed easily.  Psychiatric/Behavioral: Negative for suicidal ideas, confusion, sleep disturbance, self-injury, dysphoric mood, decreased concentration and agitation.  Family History  Problem Relation Age of Onset  . Hypertension Father   . Heart disease Father     pacemaker  . Hypertension      All 4 Grandparents  . Arthritis Maternal Grandmother   . Breast cancer Maternal Grandmother   . Lung cancer Paternal Grandmother   . Heart disease Paternal Grandfather   . Stroke Maternal Grandfather    History  Substance Use Topics  . Smoking status: Never Smoker   . Smokeless tobacco: Never Used  . Alcohol Use: Yes     Holidays only   Past Medical History  Diagnosis Date  . Asthma   . Depression   . Migraines   . Environmental allergies   . GERD (gastroesophageal reflux disease)   . Anxiety   . Sleep apnea     on CPAP  . Allergy   . Sleep apnea   .  OSA on CPAP          Objective:   Physical Exam  BP 117/74  Pulse 96  Temp(Src) 99.1 F (37.3 C) (Oral)  Ht 5\' 8"  (1.727 m)  Wt 356 lb 3.2 oz (161.571 kg)  BMI 54.16 kg/m2  SpO2 98%  General Appearance:    Alert, cooperative, no distress, appears stated age  Head:    Normocephalic, without obvious abnormality, atraumatic  Eyes:    PERRL, conjunctiva/corneas clear, EOM's intact, fundi    benign, both eyes       Ears:    Normal TM's and external ear canals, both ears  Nose:   Nares normal, septum midline, mucosa normal, no drainage   or sinus tenderness  Throat:   Lips, mucosa, and tongue normal; teeth and gums normal  Neck:   Supple, symmetrical, trachea midline, no adenopathy;       thyroid:  No enlargement/tenderness/nodules; no carotid   bruit or JVD  Back:     Symmetric, no curvature, ROM normal, no CVA tenderness  Lungs:     Clear to auscultation bilaterally, respirations unlabored  Chest wall:    No tenderness or deformity  Heart:    Regular rate and rhythm, S1 and S2 normal, no murmur, rub   or gallop  Abdomen:     Soft, non-tender, bowel sounds active all four quadrants,    no masses, no organomegaly  Genitalia:    Pt refused  Rectal:   pt refused  Extremities:   Extremities normal, atraumatic, no cyanosis or edema  Pulses:   2+ and symmetric all extremities  Skin:   Skin color, texture, turgor normal, no rashes or lesions  Lymph nodes:   Cervical, supraclavicular, and axillary nodes normal  Neurologic:   CNII-XII intact. Normal strength, sensation and reflexes      throughout        Assessment & Plan:  cpe-- check labs           ghm utd

## 2011-12-05 ENCOUNTER — Other Ambulatory Visit (INDEPENDENT_AMBULATORY_CARE_PROVIDER_SITE_OTHER): Payer: BC Managed Care – PPO

## 2011-12-05 DIAGNOSIS — Z0000 Encounter for general adult medical examination without abnormal findings: Secondary | ICD-10-CM

## 2011-12-05 LAB — BASIC METABOLIC PANEL
BUN: 11 mg/dL (ref 6–23)
CO2: 29 mEq/L (ref 19–32)
Calcium: 9.3 mg/dL (ref 8.4–10.5)
Chloride: 107 mEq/L (ref 96–112)
Creatinine, Ser: 1 mg/dL (ref 0.4–1.5)
GFR: 84 mL/min (ref >60.00)
Glucose, Bld: 89 mg/dL (ref 70–99)
Potassium: 4.3 mEq/L (ref 3.5–5.1)
Sodium: 141 mEq/L (ref 135–145)

## 2011-12-05 LAB — CBC WITH DIFFERENTIAL/PLATELET
Basophils Absolute: 0 10*3/uL (ref 0.0–0.1)
Basophils Relative: 0.8 % (ref 0.0–3.0)
Eosinophils Absolute: 0.2 10*3/uL (ref 0.0–0.7)
Eosinophils Relative: 3.2 % (ref 0.0–5.0)
HCT: 44.5 % (ref 39.0–52.0)
Hemoglobin: 14.9 g/dL (ref 13.0–17.0)
Lymphocytes Relative: 38.1 % (ref 12.0–46.0)
Lymphs Abs: 2 10*3/uL (ref 0.7–4.0)
MCHC: 33.6 g/dL (ref 30.0–36.0)
MCV: 86.4 fl (ref 78.0–100.0)
Monocytes Absolute: 0.4 10*3/uL (ref 0.1–1.0)
Monocytes Relative: 7.1 % (ref 3.0–12.0)
Neutro Abs: 2.6 10*3/uL (ref 1.4–7.7)
Neutrophils Relative %: 50.8 % (ref 43.0–77.0)
Platelets: 207 10*3/uL (ref 150.0–400.0)
RBC: 5.16 Mil/uL (ref 4.22–5.81)
RDW: 14.7 % — ABNORMAL HIGH (ref 11.5–14.6)
WBC: 5.2 10*3/uL (ref 4.5–10.5)

## 2011-12-05 LAB — TSH: TSH: 1.48 u[IU]/mL (ref 0.35–5.50)

## 2011-12-05 LAB — MICROALBUMIN / CREATININE URINE RATIO
Creatinine,U: 226.3 mg/dL
Microalb Creat Ratio: 0.6 mg/g (ref 0.0–30.0)
Microalb, Ur: 1.3 mg/dL (ref 0.0–1.9)

## 2011-12-05 LAB — LIPID PANEL
Cholesterol: 230 mg/dL — ABNORMAL HIGH (ref 0–200)
HDL: 28.8 mg/dL — ABNORMAL LOW (ref >39.00)
Total CHOL/HDL Ratio: 8
Triglycerides: 365 mg/dL — ABNORMAL HIGH (ref 0.0–149.0)
VLDL: 73 mg/dL — ABNORMAL HIGH (ref 0.0–40.0)

## 2011-12-05 LAB — LDL CHOLESTEROL, DIRECT: Direct LDL: 119.6 mg/dL

## 2011-12-05 LAB — HEPATIC FUNCTION PANEL
ALT: 30 U/L (ref 0–53)
AST: 22 U/L (ref 0–37)
Albumin: 3.8 g/dL (ref 3.5–5.2)
Alkaline Phosphatase: 55 U/L (ref 39–117)
Bilirubin, Direct: 0.1 mg/dL (ref 0.0–0.3)
Total Bilirubin: 0.6 mg/dL (ref 0.3–1.2)
Total Protein: 6.6 g/dL (ref 6.0–8.3)

## 2011-12-05 LAB — PSA: PSA: 0.28 ng/mL (ref 0.10–4.00)

## 2011-12-05 NOTE — Progress Notes (Signed)
Labs only

## 2011-12-12 ENCOUNTER — Other Ambulatory Visit: Payer: BC Managed Care – PPO

## 2011-12-12 LAB — FECAL OCCULT BLOOD, IMMUNOCHEMICAL: Fecal Occult Bld: NEGATIVE

## 2012-01-14 ENCOUNTER — Ambulatory Visit (INDEPENDENT_AMBULATORY_CARE_PROVIDER_SITE_OTHER): Payer: BC Managed Care – PPO | Admitting: Family Medicine

## 2012-01-14 ENCOUNTER — Encounter: Payer: Self-pay | Admitting: Family Medicine

## 2012-01-14 VITALS — BP 124/80 | HR 83 | Temp 98.7°F | Wt 348.8 lb

## 2012-01-14 DIAGNOSIS — J329 Chronic sinusitis, unspecified: Secondary | ICD-10-CM

## 2012-01-14 MED ORDER — CEFUROXIME AXETIL 500 MG PO TABS: 500.0000 mg | ORAL_TABLET | Freq: Two times a day (BID) | ORAL | Status: AC

## 2012-01-14 MED ORDER — PREDNISONE 10 MG PO TABS: ORAL_TABLET | ORAL | Status: DC

## 2012-01-14 NOTE — Progress Notes (Signed)
  Subjective:     Martin [name] is a 41 y.o. male who presents for evaluation of sinus pain. Symptoms include: congestion, facial pain, headaches, nasal congestion and sinus pressure. Onset of symptoms was 2 days ago. Symptoms have been gradually worsening since that time. Past history is significant for no history of pneumonia or bronchitis. Patient is a non-smoker.  The following portions of the patient's history were reviewed and updated as appropriate: allergies, current medications, past family history, past medical history, past social history, past surgical history and problem list.  Review of Systems Pertinent items are noted in HPI.   Objective:    BP 124/80  Pulse 83  Temp 98.7 F (37.1 C) (Oral)  Wt 348 lb 12.8 oz (158.215 kg)  SpO2 96% General appearance: alert, cooperative, appears stated age and mild distress Ears: normal TM's and external ear canals both ears Nose: green discharge, moderate congestion, sinus tenderness bilateral Throat: lips, mucosa, and tongue normal; teeth and gums normal Neck: mild anterior cervical adenopathy, supple, symmetrical, trachea midline and thyroid not enlarged, symmetric, no tenderness/mass/nodules Lungs: clear to auscultation bilaterally Lymph nodes: Cervical adenopathy: mild b/l    Assessment:    Acute bacterial sinusitis.    Plan:    Nasal steroids per medication orders. Antihistamines per medication orders. Ceftin per medication orders. pred taper

## 2012-01-14 NOTE — Patient Instructions (Signed)

## 2012-01-19 ENCOUNTER — Other Ambulatory Visit: Payer: Self-pay | Admitting: Family Medicine

## 2012-02-17 ENCOUNTER — Other Ambulatory Visit: Payer: Self-pay | Admitting: Obstetrics and Gynecology

## 2012-02-17 ENCOUNTER — Ambulatory Visit: Payer: BC Managed Care – PPO | Admitting: Family

## 2012-02-17 ENCOUNTER — Telehealth: Payer: Self-pay | Admitting: Family Medicine

## 2012-02-17 DIAGNOSIS — R109 Unspecified abdominal pain: Secondary | ICD-10-CM

## 2012-02-17 NOTE — Telephone Encounter (Signed)
Pt and wife walked into the office stating the pt needed to be seen and no one has called them back regarding an appt. I advised pt that we do not have any available appts here, and offered him a 3:30p appt with Sandford Craze. Pt's wife insisted that we make the pt an appt at the [facility] location. I advised her that we do not normally schedule with that office. Patient initially declined appt at [facility], but decided to make it and will call to cancel if he changes his mind and goes to urgent care.

## 2012-02-17 NOTE — Telephone Encounter (Signed)
Spoke with patient and he is at the urgent care at this time. I apologized for the inconvenience and he voiced understanding    KP

## 2012-02-17 NOTE — Telephone Encounter (Signed)
For MD to review      KP 

## 2012-02-17 NOTE — Telephone Encounter (Signed)
Pt could have been seen here-- its quick visit

## 2012-02-17 NOTE — Telephone Encounter (Signed)
Caller: Martin [name]/Patient; PCP: Lelon Perla.; CB#: [phone]; Call regarding Urinary Pain/Bleeding.  Onset of flank pain 02/14/12 along with urinary symptoms including pain on urination, frequency, urgency.  Temp/tactile.  Denies blood in urine.  Per flank pain protocol, advised being seen iwthin 4 hours; info to office for staff review/management for appointment need per office protocol.   May reach patient at [phone].

## 2012-02-18 ENCOUNTER — Ambulatory Visit
Admission: RE | Admit: 2012-02-18 | Discharge: 2012-02-18 | Disposition: A | Discharge status: Home / Self Care | Payer: BC Managed Care – PPO | Source: Ambulatory Visit | Attending: Obstetrics and Gynecology | Admitting: Obstetrics and Gynecology

## 2012-02-18 DIAGNOSIS — R109 Unspecified abdominal pain: Secondary | ICD-10-CM

## 2012-02-18 MED ORDER — IOHEXOL 300 MG/ML  SOLN
125.0000 mL | Freq: Once | INTRAMUSCULAR | Status: AC | PRN
  Administered 2012-02-18: 125 mL via INTRAVENOUS

## 2012-04-27 ENCOUNTER — Other Ambulatory Visit: Payer: Self-pay | Admitting: Family Medicine

## 2012-04-27 NOTE — Telephone Encounter (Signed)
Rx sent.    MW 

## 2012-04-28 NOTE — Telephone Encounter (Signed)
Refused Rx, sent yesterday Rx request dup.      MW

## 2012-05-29 ENCOUNTER — Other Ambulatory Visit: Payer: Self-pay | Admitting: Family Medicine

## 2012-05-29 DIAGNOSIS — M1990 Unspecified osteoarthritis, unspecified site: Secondary | ICD-10-CM

## 2012-05-31 ENCOUNTER — Other Ambulatory Visit: Payer: Self-pay | Admitting: Family Medicine

## 2012-05-31 NOTE — Telephone Encounter (Signed)
Refill for Celexa sent to pharmacy 

## 2012-05-31 NOTE — Telephone Encounter (Signed)
Refill done.  

## 2012-07-14 ENCOUNTER — Telehealth: Payer: Self-pay | Admitting: Family Medicine

## 2012-07-14 NOTE — Telephone Encounter (Signed)
Patient is going to Urgent Care (FYI)

## 2012-07-14 NOTE — Telephone Encounter (Signed)
Patient Information:  Caller Name: Ch[place]ke  Phone: [phone]  Patient: Martin, [name]  Gender: Male  DOB: [birth year]  Age: 42 Years  PCP: Lelon Perla.  Office Follow Up:  Does the office need to follow up with this patient?: No  Instructions For The Office: N/A   Symptoms  Reason For Call & Symptoms: sore throat, mild cough and sinus drainage.  pt also reports a headache.  Pt is not sure if he has a temp, he does have the chills  Reviewed Health History In EMR: Yes  Reviewed Medications In EMR: Yes  Reviewed Allergies In EMR: Yes  Reviewed Surgeries / Procedures: Yes  Date of Onset of Symptoms: 07/12/2012  Treatments Tried: benadryl  Treatments Tried Worked: No  Guideline(s) Used:  Sinus Pain and Congestion  Disposition Per Guideline:   See Today in Office  Reason For Disposition Reached:   Sinus pain (not just congestion) and fever  Advice Given:  For a Stuffy Nose - Use Nasal Washes:  Introduction: Saline (salt water) nasal irrigation (nasal wash) is an effective and simple home remedy for treating stuffy nose and sinus congestion. The nose can be irrigated by pouring, spraying, or squirting salt water into the nose and then letting it run back out.  How it Helps: The salt water rinses out excess mucus, washes out any irritants (dust, allergens) that might be present, and moistens the nasal cavity.  Pain and Fever Medicines:  For pain or fever relief, take either acetaminophen or ibuprofen.  They are over-the-counter (OTC) drugs that help treat both fever and pain. You can buy them at the drugstore.  Hydration:  Drink plenty of liquids (6-8 glasses of water daily). If the air in your home is dry, use a cool mist humidifier  Call Back If:   You become worse.  Patient Refused Recommendation:  Patient Will Go To [facility]  No appt available today; pt did not want to wait to be seen tomorrow.  Pt is going to go to UC

## 2012-07-31 ENCOUNTER — Other Ambulatory Visit: Payer: Self-pay | Admitting: Internal Medicine

## 2012-08-02 NOTE — Telephone Encounter (Signed)
Refill done.  

## 2012-09-14 ENCOUNTER — Other Ambulatory Visit: Payer: Self-pay | Admitting: Family Medicine

## 2012-10-12 ENCOUNTER — Other Ambulatory Visit: Payer: Self-pay | Admitting: Family Medicine

## 2012-10-15 ENCOUNTER — Ambulatory Visit (INDEPENDENT_AMBULATORY_CARE_PROVIDER_SITE_OTHER): Payer: BC Managed Care – PPO | Admitting: Family Medicine

## 2012-10-15 ENCOUNTER — Telehealth: Payer: Self-pay | Admitting: Family Medicine

## 2012-10-15 ENCOUNTER — Encounter: Payer: Self-pay | Admitting: Family Medicine

## 2012-10-15 VITALS — BP 118/90 | HR 88 | Temp 98.7°F | Ht 68.0 in | Wt 352.6 lb

## 2012-10-15 DIAGNOSIS — J0100 Acute maxillary sinusitis, unspecified: Secondary | ICD-10-CM

## 2012-10-15 MED ORDER — AMOXICILLIN 875 MG PO TABS: 875.0000 mg | ORAL_TABLET | Freq: Two times a day (BID) | ORAL | Status: DC

## 2012-10-15 MED ORDER — FLUTICASONE PROPIONATE 50 MCG/ACT NA SUSP: 2.0000 | Freq: Every day | NASAL | Status: DC

## 2012-10-15 NOTE — Telephone Encounter (Signed)
Noted pt has an appt with Tabori.

## 2012-10-15 NOTE — Progress Notes (Signed)
  Subjective:    Patient ID: Martin [name], male    DOB: 1970/11/26, 42 y.o.   MRN: [id number]  HPI URI- pt reports 'fairly severe allergies'.  'i usually walk around like i have a mild flu' but this is different.  + sinus pressure, HAs, dizziness, swollen uvula, sore throat.  No fevers or chills.  Mild nausea.  No cough.  No known sick contacts.  Hx of similar.  AC is broken and wife opened windows.  On zyrtec, singulair, ran out of nasal spray.  Needs new script.   Review of Systems For ROS see HPI     Objective:   Physical Exam  Vitals reviewed. Constitutional: He appears well-developed and well-nourished. No distress.  HENT:  Head: Normocephalic and atraumatic.  Right Ear: Tympanic membrane normal.  Left Ear: Tympanic membrane normal.  Nose: Mucosal edema and rhinorrhea present. Right sinus exhibits maxillary sinus tenderness and frontal sinus tenderness. Left sinus exhibits maxillary sinus tenderness and frontal sinus tenderness.  Mouth/Throat: Mucous membranes are normal. Oropharyngeal exudate and posterior oropharyngeal erythema present. No posterior oropharyngeal edema.  + PND  Eyes: Conjunctivae and EOM are normal. Pupils are equal, round, and reactive to light.  Neck: Normal range of motion. Neck supple.  Cardiovascular: Normal rate, regular rhythm and normal heart sounds.   Pulmonary/Chest: Effort normal and breath sounds normal. No respiratory distress. He has no wheezes.  + hacking cough  Lymphadenopathy:    He has no cervical adenopathy.  Skin: Skin is warm and dry.          Assessment & Plan:

## 2012-10-15 NOTE — Patient Instructions (Addendum)
This is a sinus infection Drink plenty of fluids Start the Amox twice daily Restart the Flonase- 2 sprays each nostril- daily Call with any questions or concerns Hang in there!!!

## 2012-10-15 NOTE — Telephone Encounter (Signed)
Patient Information:  Caller Name: Ahmarion  Phone: [phone]  Patient: Martin [name], Martin [name]  Gender: Male  DOB: 1971-04-26  Age: 42 Years  PCP: Lelon Perla.  Office Follow Up:  Does the office need to follow up with this patient?: No  Instructions For The Office: N/A   Symptoms  Reason For Call & Symptoms: Patient believes he has a sinus infection. Severe allergies . + headache,  +sinus pressure, right ear pain , nausea and dizzy.  Reviewed Health History In EMR: Yes  Reviewed Medications In EMR: Yes  Reviewed Allergies In EMR: Yes  Reviewed Surgeries / Procedures: Yes  Date of Onset of Symptoms: 10/14/2012  Treatments Tried: Zyrtec , flonase and benadry, singulair  Treatments Tried Worked: No  Guideline(s) Used:  Sinus Pain and Congestion  Disposition Per Guideline:   See Today in Office  Reason For Disposition Reached:   Earache  Advice Given:  For a Stuffy Nose - Use Nasal Washes:  Introduction: Saline (salt water) nasal irrigation (nasal wash) is an effective and simple home remedy for treating stuffy nose and sinus congestion. The nose can be irrigated by pouring, spraying, or squirting salt water into the nose and then letting it run back out.  How it Helps: The salt water rinses out excess mucus, washes out any irritants (dust, allergens) that might be present, and moistens the nasal cavity.  Methods: There are several ways to perform nasal irrigation. You can use a saline nasal spray bottle (available over-the-counter), a rubber ear syringe, a medical syringe without the needle, or a Neti Pot.  Pain and Fever Medicines:  For pain or fever relief, take either acetaminophen or ibuprofen.  They are over-the-counter (OTC) drugs that help treat both fever and pain. You can buy them at the drugstore.  Treat fevers above 101 F (38.3 C). The goal of fever therapy is to bring the fever down to a comfortable level. Remember that fever medicine usually lowers fever 2 degrees F  (1 - 1 1/2 degrees C).  Ibuprofen (e.g., Motrin, Advil):  Another choice is to take 600 mg (three 200 mg pills) by mouth every 8 hours.  Hydration:  Drink plenty of liquids (6-8 glasses of water daily). If the air in your home is dry, use a cool mist humidifier  Call Back If:   You become worse.  Patient Will Follow Care Advice:  YES  Appointment Scheduled:  10/15/2012 14:15:00 Appointment Scheduled Provider:  Sheliah Hatch.

## 2012-10-17 DIAGNOSIS — J0100 Acute maxillary sinusitis, unspecified: Secondary | ICD-10-CM | POA: Insufficient documentation

## 2012-10-17 NOTE — Assessment & Plan Note (Signed)
New to provider, pt w/ hx of similar.  Start abx.  Restart nasal steroid spray.  Reviewed supportive care and red flags that should prompt return.  Pt expressed understanding and is in agreement w/ plan.

## 2012-11-02 ENCOUNTER — Ambulatory Visit (INDEPENDENT_AMBULATORY_CARE_PROVIDER_SITE_OTHER): Payer: BC Managed Care – PPO | Admitting: Nurse Practitioner

## 2012-11-02 ENCOUNTER — Telehealth: Payer: Self-pay | Admitting: *Deleted

## 2012-11-02 ENCOUNTER — Ambulatory Visit (INDEPENDENT_AMBULATORY_CARE_PROVIDER_SITE_OTHER)
Admission: RE | Admit: 2012-11-02 | Discharge: 2012-11-02 | Disposition: A | Discharge status: Home / Self Care | Payer: BC Managed Care – PPO | Source: Ambulatory Visit | Attending: Nurse Practitioner | Admitting: Nurse Practitioner

## 2012-11-02 ENCOUNTER — Encounter: Payer: Self-pay | Admitting: Nurse Practitioner

## 2012-11-02 VITALS — BP 108/80 | HR 94 | Temp 100.2°F | Resp 12

## 2012-11-02 DIAGNOSIS — R509 Fever, unspecified: Secondary | ICD-10-CM

## 2012-11-02 DIAGNOSIS — R05 Cough: Secondary | ICD-10-CM

## 2012-11-02 DIAGNOSIS — R059 Cough, unspecified: Secondary | ICD-10-CM

## 2012-11-02 LAB — CBC WITH DIFFERENTIAL/PLATELET
Basophils Absolute: 0.1 10*3/uL (ref 0.0–0.1)
Basophils Relative: 1 % (ref 0–1)
Eosinophils Absolute: 0.3 10*3/uL (ref 0.0–0.7)
Eosinophils Relative: 3 % (ref 0–5)
HCT: 47.2 % (ref 39.0–52.0)
Hemoglobin: 16.5 g/dL (ref 13.0–17.0)
Lymphocytes Relative: 39 % (ref 12–46)
Lymphs Abs: 3.5 10*3/uL (ref 0.7–4.0)
MCH: 28.5 pg (ref 26.0–34.0)
MCHC: 35 g/dL (ref 30.0–36.0)
MCV: 81.5 fL (ref 78.0–100.0)
Monocytes Absolute: 0.8 10*3/uL (ref 0.1–1.0)
Monocytes Relative: 9 % (ref 3–12)
Neutro Abs: 4.4 10*3/uL (ref 1.7–7.7)
Neutrophils Relative %: 48 % (ref 43–77)
Platelets: 229 10*3/uL (ref 150–400)
RBC: 5.79 MIL/uL (ref 4.22–5.81)
RDW: 15.2 % (ref 11.5–15.5)
WBC: 9 10*3/uL (ref 4.0–10.5)

## 2012-11-02 MED ORDER — ALBUTEROL SULFATE (5 MG/ML) 0.5% IN NEBU: 2.5000 mg | INHALATION_SOLUTION | RESPIRATORY_TRACT | Status: DC

## 2012-11-02 MED ORDER — ALBUTEROL SULFATE (2.5 MG/3ML) 0.083% IN NEBU: 2.5000 mg | INHALATION_SOLUTION | Freq: Once | RESPIRATORY_TRACT | Status: DC

## 2012-11-02 MED ORDER — DOXYCYCLINE HYCLATE 100 MG PO TABS: 100.0000 mg | ORAL_TABLET | Freq: Two times a day (BID) | ORAL | Status: DC

## 2012-11-02 MED ORDER — IPRATROPIUM BROMIDE 0.02 % IN SOLN: 0.5000 mg | RESPIRATORY_TRACT | Status: DC

## 2012-11-02 NOTE — Progress Notes (Signed)
  Subjective:    Patient ID: Martin [name], male    DOB: [birth year], 42 y.o.   MRN: [id number]  Cough This is a new (cough developed yesterday, treated for sinusitis 3 weeks ago, finished amoxicillin 1 week ago.) problem. The current episode started 1 to 4 weeks ago. The problem has been gradually worsening. The problem occurs every few minutes. The cough is productive of sputum. Associated symptoms include chest pain (pain across mid-chest w/deep inspiration), chills, ear congestion, ear pain, a fever (99.0) and a sore throat. Pertinent negatives include no headaches. Associated symptoms comments: Sinus pressure. Nothing aggravates the symptoms. He has tried OTC cough suppressant for the symptoms. The treatment provided no relief. obstructive sleep apnea, uses cpap, obesity      Review of Systems  Constitutional: Positive for fever (99.0), chills and fatigue.  HENT: Positive for ear pain, congestion, sore throat and sinus pressure.   Respiratory: Positive for cough.        Bilateral lateral chest is painfulw/coughing. Anterior mid-chest painful w/deep inspiration  Cardiovascular: Positive for chest pain (pain across mid-chest w/deep inspiration).  Gastrointestinal:       Loose stool last few days  Musculoskeletal: Positive for arthralgias.  Neurological: Positive for dizziness. Negative for headaches.  Hematological: Positive for adenopathy.       Objective:   Physical Exam  Nursing note and vitals reviewed. Constitutional: He is oriented to person, place, and time. He appears well-developed and well-nourished. No distress.  HENT:  Head: Normocephalic and atraumatic.  Right Ear: Tympanic membrane, external ear and ear canal normal.  Left Ear: Tympanic membrane, external ear and ear canal normal.  Mouth/Throat: Oropharynx is clear and moist. No oropharyngeal exudate.  Eyes: Conjunctivae are normal.  Neck: Normal range of motion. Neck supple.  Poor exam due to large body habitus   Cardiovascular: Normal rate, regular rhythm and normal heart sounds.   Pulmonary/Chest: Effort normal and breath sounds normal. No respiratory distress. He has no wheezes. He has no rales.  Frequent coughing during exam  Neurological: He is alert and oriented to person, place, and time.  Skin: Skin is warm and dry.  Psychiatric: He has a normal mood and affect. His behavior is normal. Thought content normal.          Assessment & Plan:  1. Cough, fever, chest pain. DD: CAP, bronchitis - DG Chest 2 View; Future - doxycycline (VIBRA-TABS) 100 MG tablet; Take 1 tablet (100 mg total) by mouth 2 (two) times daily.  Dispense: 14 tablet; Refill: 0 - ipratropium (ATROVENT) nebulizer solution 0.5 mg; Take 2.5 mLs (0.5 mg total) by nebulization once - albuterol (PROVENTIL) (2.5 MG/ML) 0.5% nebulizer solution 2.5 mg; once - albuterol (PROVENTIL) (2.5 MG/3ML) 0.083% nebulizer solution 2.5 mg; Take 3 mLs (2.5 mg total) by nebulization once. - CBC with Differential See pt instructions.

## 2012-11-02 NOTE — Telephone Encounter (Signed)
Patient presented to the office with c/o head congestion and shortness of breath. Oxygen saturation was 98% on room air, HR 101 and T 99. Patient stated "bottom of lungs feel sore from all the coughing" but cough was not productive. No available appts in this office or [place]. First available appt was 3:45 this afternoon with Maximino Sarin at the [facility] office, pt declined UC because it would be too expensive. Patient recently treated for sinus infection in [facility] office.

## 2012-11-02 NOTE — Patient Instructions (Addendum)
Please f/u with your primary care provider in 10 days or sooner of fever is above 100.4, or you develop shortness of breath. Eat activia yogurt daily while on antibiotics to help prevent diarrhea. Rest. Feel better.

## 2012-11-03 MED ORDER — PREDNISONE 10 MG PO TABS: 10.0000 mg | ORAL_TABLET | Freq: Every day | ORAL | Status: DC

## 2012-11-03 MED ORDER — ALBUTEROL SULFATE HFA 108 (90 BASE) MCG/ACT IN AERS: 2.0000 | INHALATION_SPRAY | RESPIRATORY_TRACT | Status: DC | PRN

## 2012-11-08 ENCOUNTER — Encounter: Payer: Self-pay | Admitting: Family Medicine

## 2012-11-08 ENCOUNTER — Ambulatory Visit (INDEPENDENT_AMBULATORY_CARE_PROVIDER_SITE_OTHER): Payer: BC Managed Care – PPO | Admitting: Family Medicine

## 2012-11-08 VITALS — BP 112/80 | HR 88 | Temp 98.7°F | Wt 356.0 lb

## 2012-11-08 DIAGNOSIS — R0789 Other chest pain: Secondary | ICD-10-CM

## 2012-11-08 DIAGNOSIS — Z9109 Other allergy status, other than to drugs and biological substances: Secondary | ICD-10-CM

## 2012-11-08 DIAGNOSIS — M5380 Other specified dorsopathies, site unspecified: Secondary | ICD-10-CM

## 2012-11-08 DIAGNOSIS — J45909 Unspecified asthma, uncomplicated: Secondary | ICD-10-CM

## 2012-11-08 DIAGNOSIS — J189 Pneumonia, unspecified organism: Secondary | ICD-10-CM | POA: Insufficient documentation

## 2012-11-08 DIAGNOSIS — M62830 Muscle spasm of back: Secondary | ICD-10-CM

## 2012-11-08 LAB — CBC WITH DIFFERENTIAL/PLATELET
Basophils Absolute: 0.1 10*3/uL (ref 0.0–0.1)
Basophils Relative: 0.7 % (ref 0.0–3.0)
Eosinophils Absolute: 0.1 10*3/uL (ref 0.0–0.7)
Eosinophils Relative: 1.5 % (ref 0.0–5.0)
HCT: 46.5 % (ref 39.0–52.0)
Hemoglobin: 15.9 g/dL (ref 13.0–17.0)
Lymphocytes Relative: 36.6 % (ref 12.0–46.0)
Lymphs Abs: 3.4 10*3/uL (ref 0.7–4.0)
MCHC: 34.1 g/dL (ref 30.0–36.0)
MCV: 83.6 fl (ref 78.0–100.0)
Monocytes Absolute: 0.5 10*3/uL (ref 0.1–1.0)
Monocytes Relative: 5.5 % (ref 3.0–12.0)
Neutro Abs: 5.2 10*3/uL (ref 1.4–7.7)
Neutrophils Relative %: 55.7 % (ref 43.0–77.0)
Platelets: 235 10*3/uL (ref 150.0–400.0)
RBC: 5.57 Mil/uL (ref 4.22–5.81)
RDW: 15.5 % — ABNORMAL HIGH (ref 11.5–14.6)
WBC: 9.3 10*3/uL (ref 4.5–10.5)

## 2012-11-08 LAB — BASIC METABOLIC PANEL
BUN: 16 mg/dL (ref 6–23)
CO2: 29 mEq/L (ref 19–32)
Calcium: 9.2 mg/dL (ref 8.4–10.5)
Chloride: 105 mEq/L (ref 96–112)
Creatinine, Ser: 1.2 mg/dL (ref 0.4–1.5)
GFR: 72.91 mL/min (ref >60.00)
Glucose, Bld: 88 mg/dL (ref 70–99)
Potassium: 3.8 mEq/L (ref 3.5–5.1)
Sodium: 143 mEq/L (ref 135–145)

## 2012-11-08 LAB — HEPATIC FUNCTION PANEL
ALT: 23 U/L (ref 0–53)
AST: 17 U/L (ref 0–37)
Albumin: 3.7 g/dL (ref 3.5–5.2)
Alkaline Phosphatase: 53 U/L (ref 39–117)
Bilirubin, Direct: 0 mg/dL (ref 0.0–0.3)
Total Bilirubin: 0.5 mg/dL (ref 0.3–1.2)
Total Protein: 7.3 g/dL (ref 6.0–8.3)

## 2012-11-08 LAB — D-DIMER, QUANTITATIVE (NOT AT ARMC): D-Dimer, Quant: 0.39 ug/mL-FEU (ref 0.00–0.48)

## 2012-11-08 MED ORDER — IPRATROPIUM BROMIDE 0.02 % IN SOLN
0.5000 mg | Freq: Once | RESPIRATORY_TRACT | Status: AC
  Administered 2012-11-08: 0.5 mg via RESPIRATORY_TRACT

## 2012-11-08 MED ORDER — CYCLOBENZAPRINE HCL 10 MG PO TABS: 10.0000 mg | ORAL_TABLET | Freq: Three times a day (TID) | ORAL | Status: DC | PRN

## 2012-11-08 MED ORDER — MOMETASONE FURO-FORMOTEROL FUM 100-5 MCG/ACT IN AERO: 2.0000 | INHALATION_SPRAY | Freq: Two times a day (BID) | RESPIRATORY_TRACT | Status: DC

## 2012-11-08 MED ORDER — METHYLPREDNISOLONE ACETATE 80 MG/ML IJ SUSP
80.0000 mg | Freq: Once | INTRAMUSCULAR | Status: AC
  Administered 2012-11-08: 80 mg via INTRAMUSCULAR

## 2012-11-08 MED ORDER — CEFTRIAXONE SODIUM 1 G IJ SOLR
1.0000 g | Freq: Once | INTRAMUSCULAR | Status: AC
  Administered 2012-11-08: 1 g via INTRAMUSCULAR

## 2012-11-08 MED ORDER — CLARITHROMYCIN ER 500 MG PO TB24: 1000.0000 mg | ORAL_TABLET | Freq: Every day | ORAL | Status: AC

## 2012-11-08 MED ORDER — ALBUTEROL SULFATE (5 MG/ML) 0.5% IN NEBU
2.5000 mg | INHALATION_SOLUTION | Freq: Once | RESPIRATORY_TRACT | Status: AC
  Administered 2012-11-08: 2.5 mg via RESPIRATORY_TRACT

## 2012-11-08 NOTE — Patient Instructions (Addendum)

## 2012-11-08 NOTE — Progress Notes (Signed)
  Subjective:     Martin [name] is a 42 y.o. male here for evaluation of a cough. Onset of symptoms was 2 weeks ago. Symptoms have been gradually worsening when he presented to [facility] office but has improved slightly since that time. The cough is productive and is aggravated by infection, pollens and reclining position. Associated symptoms include: chills, fever, postnasal drip, shortness of breath, sputum production and wheezing. Patient does have a history of asthma. Patient does have a history of environmental allergens. Patient has not traveled recently. Patient does not have a history of smoking. Patient has had a previous chest x-ray. Patient has not had a PPD done.  Pt was supposed to be on allergy shots but he could not afford the injections or the repeat dr visits so he stopped seeing Dr Lucie Leather several years ago.  He states he is afraid to leave his house secondary to his allergies    The following portions of the patient's history were reviewed and updated as appropriate: allergies, current medications, past family history, past medical history, past social history, past surgical history and problem list.  Review of Systems Pertinent items are noted in HPI.    Objective:    Oxygen saturation 96% on room air BP 112/80  Pulse 88  Temp(Src) 98.7 F (37.1 C) (Oral)  Wt 356 lb (161.481 kg)  BMI 54.14 kg/m2  SpO2 96% General appearance: alert, cooperative, appears stated age and mild distress Nose: Nares normal. Septum midline. Mucosa normal. No drainage or sinus tenderness. Throat: lips, mucosa, and tongue normal; teeth and gums normal Neck: no adenopathy, supple, symmetrical, trachea midline and thyroid not enlarged, symmetric, no tenderness/mass/nodules Lungs: diminished breath sounds bilaterally Heart: S1, S2 normal Extremities: extremities normal, atraumatic, no cyanosis or edema    Assessment:    Pneumonia    Plan:    Antibiotics per medication orders. Antitussives per  medication orders. Avoid exposure to tobacco smoke and fumes. B-agonist inhaler. Call if shortness of breath worsens, blood in sputum, change in character of cough, development of fever or chills, inability to maintain nutrition and hydration. Avoid exposure to tobacco smoke and fumes. Pulmonary consult. Steroid inhaler as ordered.

## 2012-11-09 ENCOUNTER — Ambulatory Visit (INDEPENDENT_AMBULATORY_CARE_PROVIDER_SITE_OTHER): Payer: BC Managed Care – PPO | Admitting: Internal Medicine

## 2012-11-09 ENCOUNTER — Encounter: Payer: Self-pay | Admitting: Internal Medicine

## 2012-11-09 VITALS — BP 110/68 | HR 90 | Temp 98.2°F | Ht 68.0 in | Wt 354.0 lb

## 2012-11-09 DIAGNOSIS — R05 Cough: Secondary | ICD-10-CM

## 2012-11-09 DIAGNOSIS — R059 Cough, unspecified: Secondary | ICD-10-CM

## 2012-11-09 DIAGNOSIS — R0789 Other chest pain: Secondary | ICD-10-CM

## 2012-11-09 DIAGNOSIS — J189 Pneumonia, unspecified organism: Secondary | ICD-10-CM

## 2012-11-09 MED ORDER — PREDNISONE (PAK) 10 MG PO TABS: ORAL_TABLET | ORAL | Status: DC

## 2012-11-09 MED ORDER — DEXLANSOPRAZOLE 60 MG PO CPDR: 60.0000 mg | DELAYED_RELEASE_CAPSULE | Freq: Every day | ORAL | Status: DC

## 2012-11-09 MED ORDER — TRAMADOL HCL 50 MG PO TABS: ORAL_TABLET | ORAL | Status: DC

## 2012-11-09 NOTE — Assessment & Plan Note (Signed)
The most common causes of chronic cough in immunocompetent adults include the following: upper airway cough syndrome (UACS), previously referred to as postnasal drip syndrome (PNDS), which is caused by variety of rhinosinus conditions; (2) asthma; (3) GERD; (4) chronic bronchitis from cigarette smoking or other inhaled environmental irritants; (5) nonasthmatic eosinophilic bronchitis; and (6) bronchiectasis.   These conditions, singly or in combination, have accounted for up to 94% of the causes of chronic cough in prospective studies.   Other conditions have constituted no >6% of the causes in prospective studies These have included bronchogenic carcinoma, chronic interstitial pneumonia, sarcoidosis, left ventricular failure, ACEI-induced cough, and aspiration from a condition associated with pharyngeal dysfunction.    Chronic cough is often simultaneously caused by more than one condition. A single cause has been found from 38 to 82% of the time, multiple causes from 18 to 62%. Multiply caused cough has been the result of three diseases up to 42% of the time.       Of the three most common causes of chronic cough, only one (GERD)  can actually cause the other two (asthma and post nasal drip syndrome)  and perpetuate the cylce of cough inducing airway trauma, inflammation, heightened sensitivity to reflux which is prompted by the cough itself via a cyclical mechanism.    This may partially respond to steroids and look like asthma and post nasal drainage but never erradicated completely unless the cough and the secondary reflux are eliminated, preferably both at the same time.  While not intuitively obvious, many patients with chronic low grade reflux do not cough until there is a secondary insult that disturbs the protective epithelial barrier and exposes sensitive nerve endings.  This can be viral or direct physical injury such as with an endotracheal tube.   The point is that once this occurs, it is  difficult to eliminate using anything but a maximally effective acid suppression regimen at least in the short run, accompanied by an appropriate diet to address non acid GERD.   For now max gerd rx and then regroup in 2 weeks with sinus ct in meantime if needed  See instructions for specific recommendations which were reviewed directly with the patient who was given a copy with highlighter outlining the key components.

## 2012-11-09 NOTE — Patient Instructions (Addendum)
Stop zyrtec, benadryl, dulera.  The key to effective treatment for your cough is eliminating the non-stop cycle of cough you're stuck in long enough to let your airway heal completely and then see if there is anything still making you cough once you stop the cough suppression, but this should take no more than 5 days to figuure out  First take delsym two tsp every 12 hours and supplement if needed with  tramadol 50 mg up to 2 every 4 hours to suppress the urge to cough. Swallowing water or using ice chips/non mint and menthol containing candies (such as lifesavers or sugarless jolly ranchers) are also effective.  You should rest your voice and avoid activities that you know make you cough.  Once you have eliminated the cough for 3 straight days try reducing the tramadol first,  then the delsym as tolerated.    Try dexilant 60 mg  Take 30-60 min before first meal of the day and Pepcid 20 mg one bedtime until cough is completely gone for at least a week without the need for cough suppression  I think of reflux for chronic cough like I do oxygen for fire (doesn't cause the fire but once you get the oxygen suppressed it usually goes away regardless of the exact cause).  GERD (REFLUX)  is an extremely common cause of respiratory symptoms, many times with no significant heartburn at all.    It can be treated with medication, but also with lifestyle changes including avoidance of late meals, excessive alcohol, smoking cessation, and avoid fatty foods, chocolate, peppermint, colas, red wine, and acidic juices such as orange juice.  NO MINT OR MENTHOL PRODUCTS SO NO COUGH DROPS  USE SUGARLESS CANDY INSTEAD (jolley ranchers or Stover's)  NO OIL BASED VITAMINS - use powdered substitutes.   Prednisone 10 mg take  4 each am x 2 days,   2 each am x 2 days,  1 each am x 2 days and stop    Chlorpheniramine 4 mg every 6 hours (but may cause drowsiness)  Call Libby at [phone] and she will order a sinus Ct if  not better by Monday June 9th  Please schedule a follow up office visit in 2 weeks, sooner if needed

## 2012-11-09 NOTE — Progress Notes (Signed)
  Subjective:    Patient ID: Martin [name], male    DOB: 12-01-1970  MRN: [id number]  HPI  34 yowm never smoker no problems as child with resp problems until around 2000s developed intermittent sob seen by [facility] with pos allergy tests rec consider shots and better some with albuterol but since daily symptoms of nasal stuffiness, need to clear the throat and variable sob so referred back to pulmonary by Dr Laury Axon  11/09/2012 1st pulmonary eval cc much worse than usual nasal and chest congestion x abrupt onset xone week assoc with fever and aching all over which resolved on doxy/ pred but breathing no better, not better with albuterol and so dulera added  6/2 and biaxin started.  No purulent sputum or fever now. No sinus pressure. Already on ppi but takes in pm.   Cough worse with voice use.   No other  obvious daytime variabilty or assoc   cp or chest tightness, subjective wheeze overt sinus or hb symptoms. No unusual exp hx or h/o childhood pna/ asthma or premature birth to his knowledge.   Sleeping ok without nocturnal  or early am exacerbation  of respiratory  c/o's or need for noct saba. Also denies any obvious fluctuation of symptoms with weather or environmental changes or other aggravating or alleviating factors except as outlined above   Review of Systems  Constitutional: Negative for fever, chills, activity change, appetite change and unexpected weight change.  HENT: Positive for ear pain, congestion and sore throat. Negative for rhinorrhea, sneezing, trouble swallowing, dental problem, voice change and postnasal drip.   Eyes: Negative for visual disturbance.  Respiratory: Positive for cough and shortness of breath. Negative for choking.   Cardiovascular: Negative for chest pain and leg swelling.  Gastrointestinal: Negative for nausea, vomiting and abdominal pain.  Genitourinary: Negative for difficulty urinating.  Musculoskeletal: Negative for arthralgias.  Skin: Negative for rash.   Psychiatric/Behavioral: Negative for behavioral problems and confusion.       Objective:   Physical Exam  Wt Readings from Last 3 Encounters:  11/09/12 354 lb (160.573 kg)  11/08/12 356 lb (161.481 kg)  10/15/12 352 lb 9.6 oz (159.938 kg)    amb wm with nl vital signs HEENT: nl dentition, turbinates, and orophanx. Nl external ear canals without cough reflex   NECK :  without JVD/Nodes/TM/ nl carotid upstrokes bilaterally   LUNGS: no acc muscle use, clear to A and P bilaterally without cough on insp or exp maneuvers   CV:  RRR  no s3 or murmur or increase in P2, no edema   ABD:  soft and nontender with nl excursion in the supine position. No bruits or organomegaly, bowel sounds nl  MS:  warm without deformities, calf tenderness, cyanosis or clubbing  SKIN: warm and dry without lesions    NEURO:  alert, approp, no deficits    cxr 11/02/12 Mild increase in interstitial markings with accentuation and  central peribronchial cuffing may indicate underlying viral  pneumonitis or reactive airway disease. No focal infiltrates      Assessment & Plan:

## 2012-11-12 ENCOUNTER — Ambulatory Visit: Payer: BC Managed Care – PPO | Admitting: Family Medicine

## 2012-11-23 ENCOUNTER — Ambulatory Visit (INDEPENDENT_AMBULATORY_CARE_PROVIDER_SITE_OTHER): Payer: BC Managed Care – PPO | Admitting: Internal Medicine

## 2012-11-23 ENCOUNTER — Ambulatory Visit (INDEPENDENT_AMBULATORY_CARE_PROVIDER_SITE_OTHER)
Admission: RE | Admit: 2012-11-23 | Discharge: 2012-11-23 | Disposition: A | Discharge status: Home / Self Care | Payer: BC Managed Care – PPO | Source: Ambulatory Visit | Attending: Internal Medicine | Admitting: Internal Medicine

## 2012-11-23 ENCOUNTER — Encounter: Payer: Self-pay | Admitting: Internal Medicine

## 2012-11-23 VITALS — BP 136/80 | HR 99 | Temp 99.1°F | Ht 66.0 in | Wt 345.0 lb

## 2012-11-23 DIAGNOSIS — R059 Cough, unspecified: Secondary | ICD-10-CM

## 2012-11-23 DIAGNOSIS — R05 Cough: Secondary | ICD-10-CM

## 2012-11-23 NOTE — Progress Notes (Signed)
Quick Note:  Spoke with pt and notified of results per Dr. Wert. Pt verbalized understanding and denied any questions.  ______ 

## 2012-11-23 NOTE — Progress Notes (Signed)
Subjective:    Patient ID: Martin [name], male    DOB: [birth year]  MRN: [id number]  HPI  6 yowm never smoker no problems as child with resp problems until around 2000s developed intermittent sob seen by [facility] with pos allergy tests rec consider shots and better some with albuterol but since daily symptoms of nasal stuffiness, need to clear the throat and variable sob so referred back to pulmonary by Dr Laury Axon  11/09/2012 1st pulmonary eval cc much worse than usual nasal and chest congestion x abrupt onset x one week prior to OV  assoc with fever and aching all over which resolved on doxy/ pred but breathing no better, not better with albuterol and so dulera added  6/2 and biaxin started.  No purulent sputum or fever now. No sinus pressure. Already on ppi but takes in pm.   Cough worse with voice use.   Stop zyrtec, benadryl, dulera. First take delsym two tsp every 12 hours and supplement if needed with  tramadol 50 mg up to 2 every 4 hours to suppress the urge to cough   Try dexilant 60 mg  Take 30-60 min before first meal of the day and Pepcid 20 mg one bedtime until cough is completely gone  GERD diet  Prednisone 10 mg take  4 each am x 2 days,   2 each am x 2 days,  1 each am x 2 days and stop   Chlorpheniramine 4 mg every 6 hours (but may cause drowsiness) Call Libby at [phone] and she will order a sinus Ct if not better by Monday June 9th   11/23/2012 f/u ov/Tessica Cupo re cough Chief Complaint  Patient presents with  . Follow-up    Breathing improved since the last visit, he feels breathing back at baseline. Cough is some better, but still coughing up occ clear to white sputum. No new co's today.    no longer taking tramadol but still urge to cough and clear throat p stirs in am but doesn't wake up, cough now completely dry and painful in his throat s dysphagia or over HB.  No other  obvious daytime variabilty or assoc   cp or chest tightness, subjective wheeze overt sinus  symptoms. No  unusual exp hx or h/o childhood pna/ asthma or premature birth to his knowledge.   Sleeping ok without nocturnal  or early am exacerbation  of respiratory  c/o's or need for noct saba. Also denies any obvious fluctuation of symptoms with weather or environmental changes or other aggravating or alleviating factors except as outlined above   Current Medications, Allergies, Past Medical History, Past Surgical History, Family History, and Social History were reviewed in [employer] record.  ROS  The following are not active complaints unless bolded sore throat, dysphagia, dental problems, itching, sneezing,  nasal congestion or excess/ purulent secretions, ear ache,   fever, chills, sweats, unintended wt loss, pleuritic or exertional cp, hemoptysis,  orthopnea pnd or leg swelling, presyncope, palpitations, heartburn, abdominal pain, anorexia, nausea, vomiting, diarrhea  or change in bowel or urinary habits, change in stools or urine, dysuria,hematuria,  rash, arthralgias, visual complaints, headache, numbness weakness or ataxia or problems with walking or coordination,  change in mood/affect or memory.          Objective:   Physical Exam  11/23/2012       345  Wt Readings from Last 3 Encounters:  11/09/12 354 lb (160.573 kg)  11/08/12 356 lb (161.481 kg)  10/15/12 352 lb 9.6 oz (159.938 kg)    amb wm with nl vital signs, occ throat clearing  HEENT: nl dentition, turbinates, and orophanx. Nl external ear canals without cough reflex   NECK :  without JVD/Nodes/TM/ nl carotid upstrokes bilaterally   LUNGS: no acc muscle use, clear to A and P bilaterally without cough on insp or exp maneuvers   CV:  RRR  no s3 or murmur or increase in P2, no edema   ABD:  soft and nontender with nl excursion in the supine position. No bruits or organomegaly, bowel sounds nl  MS:  warm without deformities, calf tenderness, cyanosis or clubbing       cxr 11/02/12 Mild increase in  interstitial markings with accentuation and  central peribronchial cuffing may indicate underlying viral  pneumonitis or reactive airway disease. No focal infiltrates      Assessment & Plan:

## 2012-11-23 NOTE — Patient Instructions (Addendum)
Please see patient coordinator before you leave today  to schedule sinus ct   Continue full cough suppression for at 72 hours with delsym and tramadol and continue the daytime chlortrimeton   Please schedule a follow up office visit in 3 weeks, sooner if needed  Late add consider   neurontin next ov

## 2012-11-24 NOTE — Assessment & Plan Note (Addendum)
-   sinus ct 11/23/2012 >>> Visualized paranasal sinuses are clear  In the absence of sinus ct, the throat clearing is either functional/neuralgia like,  Due to excess pnds, or due to GERD which includes non-acid injury promoted by excessive throat clearing.  For now max rx for acid reflux, diet aimed at non-acid gerd, and continue h1 per guidelines with next option adding neurontin

## 2012-12-02 ENCOUNTER — Other Ambulatory Visit: Payer: Self-pay | Admitting: Internal Medicine

## 2012-12-04 ENCOUNTER — Other Ambulatory Visit: Payer: Self-pay | Admitting: Family Medicine

## 2012-12-14 ENCOUNTER — Encounter: Payer: Self-pay | Admitting: Internal Medicine

## 2012-12-14 ENCOUNTER — Encounter: Payer: Self-pay | Admitting: Gastroenterology

## 2012-12-14 ENCOUNTER — Ambulatory Visit (INDEPENDENT_AMBULATORY_CARE_PROVIDER_SITE_OTHER): Payer: BC Managed Care – PPO | Admitting: Internal Medicine

## 2012-12-14 VITALS — BP 120/84 | HR 90 | Temp 98.9°F | Ht 68.0 in | Wt 347.8 lb

## 2012-12-14 DIAGNOSIS — R05 Cough: Secondary | ICD-10-CM

## 2012-12-14 DIAGNOSIS — K219 Gastro-esophageal reflux disease without esophagitis: Secondary | ICD-10-CM | POA: Insufficient documentation

## 2012-12-14 DIAGNOSIS — R059 Cough, unspecified: Secondary | ICD-10-CM

## 2012-12-14 MED ORDER — ESOMEPRAZOLE MAGNESIUM 40 MG PO CPDR: 40.0000 mg | DELAYED_RELEASE_CAPSULE | Freq: Every day | ORAL | Status: DC

## 2012-12-14 NOTE — Progress Notes (Signed)
Subjective:    Patient ID: Martin [name], male    DOB: March 13, 1971  MRN: [id number]   Brief patient profile:  26 yowm never smoker no problems as child with resp problems until around 2000s developed intermittent sob seen by [facility] with pos allergy tests rec consider shots and better some with albuterol but since daily symptoms of nasal stuffiness, need to clear the throat and variable sob so referred  to pulmonary by Dr Laury Axon  11/09/2012 1st pulmonary eval cc much worse than usual nasal and chest congestion x abrupt onset x one week prior to OV  assoc with fever and aching all over which resolved on doxy/ pred but breathing no better, not better with albuterol and so dulera added  6/2 and biaxin started.  No purulent sputum or fever now. No sinus pressure. Already on ppi but takes in pm.   Cough worse with voice use.   Stop zyrtec, benadryl, dulera. First take delsym two tsp every 12 hours and supplement if needed with  tramadol 50 mg up to 2 every 4 hours to suppress the urge to cough   Try dexilant 60 mg  Take 30-60 min before first meal of the day and Pepcid 20 mg one bedtime until cough is completely gone  GERD diet  Prednisone 10 mg take  4 each am x 2 days,   2 each am x 2 days,  1 each am x 2 days and stop   Chlorpheniramine 4 mg every 6 hours (but may cause drowsiness) Call Libby at [phone] and she will order a sinus Ct > neg   11/23/2012 f/u ov/Wert re cough Chief Complaint  Patient presents with  . Follow-up    Breathing improved since the last visit, he feels breathing back at baseline. Cough is some better, but still coughing up occ clear to white sputum. No new co's today.    no longer taking tramadol but still urge to cough and clear throat p stirs in am but doesn't wake up, cough now completely dry and painful in his throat s dysphagia or overt HB. rec Continue full cough suppression for at 72 hours with delsym and tramadol and continue the daytime chlortrimeton prn  12/14/2012  f/u ov/Wert re uacs much better on above rx Chief Complaint  Patient presents with  . Follow-up    Pt states that his cough has pretty much resolved. He c/o sinus pressure and nasal congestion- relates to allergies.    no longer needing much tramadol , perfume bothers him most    No other  obvious daytime variabilty or assoc sob cp or chest tightness, subjective wheeze overt sinus  symptoms. No unusual exp hx or h/o childhood pna/ asthma or premature birth to his knowledge.   Sleeping ok without nocturnal  or early am exacerbation  of respiratory  c/o's or need for noct saba. Also denies any obvious fluctuation of symptoms with weather or environmental changes or other aggravating or alleviating factors except as outlined above   Current Medications, Allergies, Past Medical History, Past Surgical History, Family History, and Social History were reviewed in [employer] record.  ROS  The following are not active complaints unless bolded sore throat, dysphagia, dental problems, itching, sneezing,  nasal congestion or excess/ purulent secretions, ear ache,   fever, chills, sweats, unintended wt loss, pleuritic or exertional cp, hemoptysis,  orthopnea pnd or leg swelling, presyncope, palpitations, heartburn, abdominal pain, anorexia, nausea, vomiting, diarrhea  or change in bowel  or urinary habits, change in stools or urine, dysuria,hematuria,  rash, arthralgias, visual complaints, headache, numbness weakness or ataxia or problems with walking or coordination,  change in mood/affect or memory.          Objective:   Physical Exam  11/23/2012       345 > 378 12/14/2012  Wt Readings from Last 3 Encounters:  11/09/12 354 lb (160.573 kg)  11/08/12 356 lb (161.481 kg)  10/15/12 352 lb 9.6 oz (159.938 kg)    amb wm with nl vital signs, no longer throat clearing   HEENT: nl dentition, turbinates, and orophanx. Nl external ear canals without cough reflex   NECK :  without  JVD/Nodes/TM/ nl carotid upstrokes bilaterally   LUNGS: no acc muscle use, clear to A and P bilaterally without cough on insp or exp maneuvers   CV:  RRR  no s3 or murmur or increase in P2, no edema   ABD:  soft and nontender with nl excursion in the supine position. No bruits or organomegaly, bowel sounds nl  MS:  warm without deformities, calf tenderness, cyanosis or clubbing       cxr 11/02/12 Mild increase in interstitial markings with accentuation and  central peribronchial cuffing may indicate underlying viral  pneumonitis or reactive airway disease. No focal infiltrates  11/23/12 sinus ct > neg      Assessment & Plan:

## 2012-12-14 NOTE — Patient Instructions (Addendum)
No change in reflux medications for now but I would consider seeing one of our GI doctors longterm > Please see patient coordinator before you leave today  to schedule GI eval   I would consider re-evaluation by Dr Lucie Leather for shots  Adjust chlortrimeton 4mg  as it is the best for cough related allergies that are not asthma related  The other option is called neurontin (reduces the nerve in throat from excessive firing)   If you are satisfied with your treatment plan let your doctor know and he/she can either refill your medications or you can return here when your prescription runs out.     If in any way you are not 100% satisfied,  please tell us.  If 100% better, tell your friends!

## 2012-12-15 ENCOUNTER — Encounter: Payer: Self-pay | Admitting: *Deleted

## 2012-12-15 NOTE — Assessment & Plan Note (Signed)
-   sinus ct 11/23/2012 >>> Visualized paranasal sinuses are clear  In retrospect this is  Classic Upper airway cough syndrome, so named because it's frequently impossible to sort out how much is  CR/sinusitis with freq throat clearing (which can be related to primary GERD)   vs  causing  secondary (" extra esophageal")  GERD from wide swings in gastric pressure that occur with throat clearing, often  promoting self use of mint and menthol lozenges that reduce the lower esophageal sphincter tone and exacerbate the problem further in a cyclical fashion.   These are the same pts (now being labeled as having "irritable larynx syndrome" by some cough centers) who not infrequently have a history of having failed to tolerate ace inhibitors,  dry powder inhalers or biphosphonates or report having atypical reflux symptoms that don't respond to standard doses of PPI , and are easily confused as having aecopd or asthma flares by even experienced allergists/ pulmonologists.    I had an extended summary discussion with the patient today reviewing his progress to date and lasting 15 to 20 minutes of a 25 minute visit on the following issues:   For allergic component which may be the "spark" rec he return to Dr Lucie Leather and in meantime continue 1st gen H1 since tolerating well  For GERD which may be the "fuel" he should see GE  For persistent cough despite addressing both spark and fuel should consider neurontin here.

## 2012-12-22 ENCOUNTER — Encounter: Payer: Self-pay | Admitting: Gastroenterology

## 2012-12-22 ENCOUNTER — Telehealth: Payer: Self-pay | Admitting: Internal Medicine

## 2012-12-22 ENCOUNTER — Ambulatory Visit (INDEPENDENT_AMBULATORY_CARE_PROVIDER_SITE_OTHER): Payer: BC Managed Care – PPO | Admitting: Gastroenterology

## 2012-12-22 VITALS — BP 110/80 | HR 87 | Ht 68.0 in | Wt 343.4 lb

## 2012-12-22 DIAGNOSIS — R05 Cough: Secondary | ICD-10-CM

## 2012-12-22 DIAGNOSIS — F4323 Adjustment disorder with mixed anxiety and depressed mood: Secondary | ICD-10-CM

## 2012-12-22 DIAGNOSIS — Z9109 Other allergy status, other than to drugs and biological substances: Secondary | ICD-10-CM

## 2012-12-22 DIAGNOSIS — R059 Cough, unspecified: Secondary | ICD-10-CM

## 2012-12-22 DIAGNOSIS — K219 Gastro-esophageal reflux disease without esophagitis: Secondary | ICD-10-CM

## 2012-12-22 DIAGNOSIS — Z889 Allergy status to unspecified drugs, medicaments and biological substances status: Secondary | ICD-10-CM

## 2012-12-22 NOTE — Patient Instructions (Addendum)
You watched a video today on acid reflux and information is below.  You have been scheduled for a Barium Esophogram at [facility] (1st floor of the hospital) on 12-23-2012 at 10:30 am. Please arrive 15 minutes prior to your appointment for registration. Make certain not to have anything to eat or drink 6 hours prior to your test. If you need to reschedule for any reason, please contact radiology at [phone] to do so. __________________________________________________________________ A barium swallow is an examination that concentrates on views of the esophagus. This tends to be a double contrast exam (barium and two liquids which, when combined, create a gas to distend the wall of the oesophagus) or single contrast (non-ionic iodine based). The study is usually tailored to your symptoms so a good history is essential. Attention is paid during the study to the form, structure and configuration of the esophagus, looking for functional disorders (such as aspiration, dysphagia, achalasia, motility and reflux) EXAMINATION  You may be asked to change into a gown, depending on the type of swallow being performed. A radiologist and radiographer will perform the procedure. The radiologist will advise you of the type of contrast selected for your procedure and direct you during the exam. You will be asked to stand, sit or lie in several different positions and to hold a small amount of fluid in your mouth before being asked to swallow while the imaging is performed .In some instances you may be asked to swallow barium coated marshmallows to assess the motility of a solid food bolus. The exam can be recorded as a digital or video fluoroscopy procedure. POST PROCEDURE It will take 1-2 days for the barium to pass through your system. To facilitate this, it is important, unless otherwise directed, to increase your fluids for the next 24-48hrs and to resume your normal diet.  This test typically takes  about 30 minutes to perform. __________________________________________________________________________________   Diet for Gastroesophageal Reflux Disease, Adult Reflux (acid reflux) is when acid from your stomach flows up into the esophagus. When acid comes in contact with the esophagus, the acid causes irritation and soreness (inflammation) in the esophagus. When reflux happens often or so severely that it causes damage to the esophagus, it is called gastroesophageal reflux disease (GERD). Nutrition therapy can help ease the discomfort of GERD. FOODS OR DRINKS TO AVOID OR LIMIT  Smoking or chewing tobacco. Nicotine is one of the most potent stimulants to acid production in the gastrointestinal tract.  Caffeinated and decaffeinated coffee and black tea.  Regular or low-calorie carbonated beverages or energy drinks (caffeine-free carbonated beverages are allowed).   Strong spices, such as black pepper, white pepper, red pepper, cayenne, curry powder, and chili powder.  Peppermint or spearmint.  Chocolate.  High-fat foods, including meats and fried foods. Extra added fats including oils, butter, salad dressings, and nuts. Limit these to less than 8 tsp per day.  Fruits and vegetables if they are not tolerated, such as citrus fruits or tomatoes.  Alcohol.  Any food that seems to aggravate your condition. If you have questions regarding your diet, call your caregiver or a registered dietitian. OTHER THINGS THAT MAY HELP GERD INCLUDE:   Eating your meals slowly, in a relaxed setting.  Eating 5 to 6 small meals per day instead of 3 large meals.  Eliminating food for a period of time if it causes distress.  Not lying down until 3 hours after eating a meal.  Keeping the head of your bed raised 6  to 9 inches (15 to 23 cm) by using a foam wedge or blocks under the legs of the bed. Lying flat may make symptoms worse.  Being physically active. Weight loss may be helpful in reducing  reflux in overweight or obese adults.  Wear loose fitting clothing EXAMPLE MEAL PLAN This meal plan is approximately 2,000 calories based on https://www.[employer].org/ meal planning guidelines. Breakfast   cup cooked oatmeal.  1 cup strawberries.  1 cup low-fat milk.  1 oz almonds. Snack  1 cup cucumber slices.  6 oz yogurt (made from low-fat or fat-free milk). Lunch  2 slice whole-wheat bread.  2 oz sliced [place].  2 tsp mayonnaise.  1 cup blueberries.  1 cup snap peas. Snack  6 whole-wheat crackers.  1 oz string cheese. Dinner   cup brown rice.  1 cup mixed veggies.  1 tsp olive oil.  3 oz grilled fish. Document Released: 05/26/2005 Document Revised: 08/18/2011 Document Reviewed: 04/11/2011 ExitCare Patient Information 2014 [employer].   ________________________________________________________________________________________________                                              We are excited to introduce MyChart, a new best-in-class service that provides you online access to important information in your electronic medical record. We want to make it easier for you to view your health information - all in one secure location - when and where you need it. We expect MyChart will enhance the quality of care and service we provide.  When you register for MyChart, you can:    View your test results.    Request appointments and receive appointment reminders via email.    Request medication renewals.    View your medical history, allergies, medications and immunizations.    Communicate with your physician's office through a password-protected site.    Conveniently print information such as your medication lists.  To find out if MyChart is right for you, please talk to a member of our clinical staff today. We will gladly answer your questions about this free health and wellness tool.  If you are age 43 or older and want a member of your family to have  access to your record, you must provide written consent by completing a proxy form available at our office. Please speak to our clinical staff about guidelines regarding accounts for patients younger than age 53.  As you activate your MyChart account and need any technical assistance, please call the MyChart technical support line at [phone]-CHART [phone]) or email your question to [email or web address]. If you email your question(s), please include your name, a return phone number and the best time to reach you.  If you have non-urgent health-related questions, you can send a message to our office through MyChart at [email or web address]. If you have a medical emergency, call 911.  Thank you for using MyChart as your new health and wellness resource!   MyChart licensed from [employer],  [id number]. Patents Pending.

## 2012-12-22 NOTE — Progress Notes (Signed)
History of Present Illness:  This is a very pleasant 42 year old Caucasian male with morbid obesity and severe sleep apnea.  He also has severe chronic allergies and is under the care of an allergist.  He is referred today for evaluation of acid reflux which she's had for over 25 years with associated burning substernal chest pain and regurgitation.  He denies dysphagia, but will have some acid reflux at night if he does not take any H2 blocker.  His not had previous endoscopy because of his obesity.  Her current reflux regime per Dr. Sandrea Hughs the patient is free of reflux symptoms and denies active reactive airway disease problems.  He does have a chronic nonproductive cough which seems to be doing well.  He denies lower GI or hepatobiliary problems.  Family history is noncontributory.  He does not smoke or abuse ethanol.  She's been obese for many years.  Previous bariatric surgery has been suggested, but he has not completed this because of cost restraints.  Other problems include chronic anxiety and depression, previous appendectomy, recurrent kidney stones, and apparently recently was treated for aspiration pneumonia.  I have reviewed this patient's present history, medical and surgical past history, allergies and medications.      ROS:   All systems were reviewed and are negative unless otherwise stated in the HPI.,,,  Chronic allergy problems, chronic back pain, chronic cough, chronic depression, recurrent headaches, various myalgias, shortness of breath with exertion, chronic insomnia, chronic edema of his lower extremities and frequent urination.    Physical Exam: Morbidly obese patient in no acute distress.  Blood pressure 110/80, pulse 87 and weight 343 with a BMI of 52.22. General well developed well nourished patient in no acute distress, appearing their stated age Eyes PERRLA, no icterus, fundoscopic exam per opthamologist Skin no lesions noted Neck supple, no adenopathy, no thyroid  enlargement, no tenderness Chest clear to percussion and auscultation Heart no significant murmurs, gallops or rubs noted Abdomen no hepatosplenomegaly masses or tenderness, BS normal.  Exam difficult because of obesity but no definite masses or tenderness.. Extremities no acute joint lesions, edema, phlebitis or evidence of cellulitis. Neurologic patient oriented x 3, cranial nerves intact, no focal neurologic deficits noted. Psychological mental status normal and normal affect.  Assessment and plan: This patient has chronic GERD doing well currently on Nexium 40 mg every morning and an H2 blocker at bedtime with standard antireflux maneuvers which I again reviewed today.  He has not a candidate for conscious sedation and endoscopy because of his morbid obesity, excessive weight, and sleep apnea.  This would be a risky procedure for him and would require intubation before endoscopic exam which I do not feel is indicated because of the risk/ benefit ratio.  I have ordered upper GI-barium swallow, and I suspect he has a significant hiatal hernia.  I have urged him strongly to consider bariatric surgery, if this is not feasible, to begin an aerobic exercise program with gradual weight loss.  His acid reflux and aspiration is obviously directly related to his morbid obesity.  He does not have Raynaud's phenomenon or any symptoms of collagen vascular disease.  Some of his coughing is also directly related to his rather severe atopy managed by his allergies.  I've advised him that he can increase Nexium to 40 mg twice a day before breakfast and dinner if needed, he saw our patient education video on acid reflux and its management.  The key his treatment for almost all of  his problems will be weight loss fully achieved in one way or another.  I do feel he would be a good candidate for bariatric surgery.  CC: Dr. Sherene Sires and Dr. Lucie Leather  Encounter Diagnosis  Name Primary?  . GERD (gastroesophageal reflux disease)  Yes

## 2012-12-22 NOTE — Telephone Encounter (Signed)
lmomtcb x1 for Martin

## 2012-12-23 ENCOUNTER — Encounter: Payer: Self-pay | Admitting: *Deleted

## 2012-12-23 ENCOUNTER — Ambulatory Visit (HOSPITAL_COMMUNITY)
Admission: RE | Admit: 2012-12-23 | Discharge: 2012-12-23 | Disposition: A | Discharge status: Home / Self Care | Payer: BC Managed Care – PPO | Source: Ambulatory Visit | Attending: Gastroenterology | Admitting: Gastroenterology

## 2012-12-23 DIAGNOSIS — K449 Diaphragmatic hernia without obstruction or gangrene: Secondary | ICD-10-CM | POA: Insufficient documentation

## 2012-12-23 DIAGNOSIS — Z8701 Personal history of pneumonia (recurrent): Secondary | ICD-10-CM | POA: Insufficient documentation

## 2012-12-23 DIAGNOSIS — K219 Gastro-esophageal reflux disease without esophagitis: Secondary | ICD-10-CM | POA: Insufficient documentation

## 2012-12-23 DIAGNOSIS — R05 Cough: Secondary | ICD-10-CM | POA: Insufficient documentation

## 2012-12-23 DIAGNOSIS — R059 Cough, unspecified: Secondary | ICD-10-CM | POA: Insufficient documentation

## 2012-12-23 NOTE — Telephone Encounter (Signed)
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 Please advise if ok to do letter. Pt will pick-up when ready. Carron Curie, CMA

## 2012-12-23 NOTE — Telephone Encounter (Signed)
Fine with me - just figure out what day he wants to go back

## 2012-12-23 NOTE — Telephone Encounter (Signed)
Spoke with the pt He states ready to go back to work now I have printed letter for him and left up front for pick up per pt request Nothing further needed per pt

## 2012-12-23 NOTE — Telephone Encounter (Signed)
LMTCB

## 2012-12-23 NOTE — Telephone Encounter (Signed)
Pt returned triage's call & can be reached at [phone].  Martin [name]

## 2012-12-30 ENCOUNTER — Telehealth: Payer: Self-pay | Admitting: *Deleted

## 2012-12-30 NOTE — Telephone Encounter (Signed)
Message copied by Florene Glen on Thu Dec 30, 2012  1:17 PM ------      Message from: Jarold Motto, DAVID R      Created: Thu Dec 30, 2012  9:56 AM       He needs esophageal manometry ------

## 2012-12-30 NOTE — Telephone Encounter (Signed)
Pt scheduled for EM on 8//4/14. Faxed him prep instructions and the BS result. Pt stated understanding.

## 2012-12-30 NOTE — Telephone Encounter (Signed)
Informed pt of BS results and the need for an EM; lmom for pt to call back with options.

## 2013-01-10 ENCOUNTER — Encounter (HOSPITAL_COMMUNITY)
Admission: RE | Disposition: A | Discharge status: Home / Self Care | Payer: Self-pay | Source: Ambulatory Visit | Attending: Gastroenterology

## 2013-01-10 ENCOUNTER — Ambulatory Visit (HOSPITAL_COMMUNITY)
Admission: RE | Admit: 2013-01-10 | Discharge: 2013-01-10 | Disposition: A | Discharge status: Home / Self Care | Payer: BC Managed Care – PPO | Source: Ambulatory Visit | Attending: Gastroenterology | Admitting: Gastroenterology

## 2013-01-10 ENCOUNTER — Encounter (HOSPITAL_COMMUNITY): Payer: Self-pay

## 2013-01-10 DIAGNOSIS — R059 Cough, unspecified: Secondary | ICD-10-CM | POA: Insufficient documentation

## 2013-01-10 DIAGNOSIS — R05 Cough: Secondary | ICD-10-CM | POA: Insufficient documentation

## 2013-01-10 DIAGNOSIS — R933 Abnormal findings on diagnostic imaging of other parts of digestive tract: Secondary | ICD-10-CM | POA: Insufficient documentation

## 2013-01-10 DIAGNOSIS — K219 Gastro-esophageal reflux disease without esophagitis: Secondary | ICD-10-CM | POA: Insufficient documentation

## 2013-01-10 DIAGNOSIS — R1310 Dysphagia, unspecified: Secondary | ICD-10-CM | POA: Insufficient documentation

## 2013-01-10 HISTORY — PX: ESOPHAGEAL MANOMETRY: SHX5429

## 2013-01-10 SURGERY — MANOMETRY, ESOPHAGUS
Anesthesia: Topical

## 2013-01-10 MED ORDER — LIDOCAINE VISCOUS 2 % MT SOLN
OROMUCOSAL | Status: AC
  Filled 2013-01-10: qty 15

## 2013-01-10 SURGICAL SUPPLY — 2 items
DRAPE UTILITY 15X26 W/TAPE STR (DRAPE) ×2 IMPLANT
GOWN PREVENTION PLUS LG XLONG (DISPOSABLE) ×2 IMPLANT

## 2013-01-11 ENCOUNTER — Telehealth: Payer: Self-pay | Admitting: *Deleted

## 2013-01-11 ENCOUNTER — Encounter (HOSPITAL_COMMUNITY): Payer: Self-pay | Admitting: Gastroenterology

## 2013-01-11 NOTE — Telephone Encounter (Signed)
Called patient and advised him that his esophageal manometry was normal I advised patient of Dr. Norval Gable recommendations to continue his acid reflux medication and work on weight loss Patient verbalized understanding

## 2013-02-20 ENCOUNTER — Other Ambulatory Visit: Payer: Self-pay | Admitting: Family Medicine

## 2013-04-14 ENCOUNTER — Other Ambulatory Visit: Payer: Self-pay

## 2013-07-07 ENCOUNTER — Other Ambulatory Visit: Payer: Self-pay | Admitting: Family Medicine

## 2013-07-07 NOTE — Telephone Encounter (Signed)
flonase refilled per protocol. JG//CMA

## 2013-12-21 IMAGING — CT CT PARANASAL SINUSES LIMITED
1 of 2 series · 16 of 19 positions shown, 20 images · non-contrast
Comparison: 07/21/2005.

CLINICAL DATA: Recent pneumonia.  Facial tenderness.

CT PARANASAL SINUS LIMITED WITHOUT CONTRAST
TECHNIQUE: Multidetector CT images of the paranasal sinuses were
obtained in a single plane without contrast.

[Series 4: ltd sinus 3.0 h30s · axial · 0.37mm/px · z∈[-79,+16]mm · 16 of 18 slices shown, 20 images]
[im 2/18  brain]
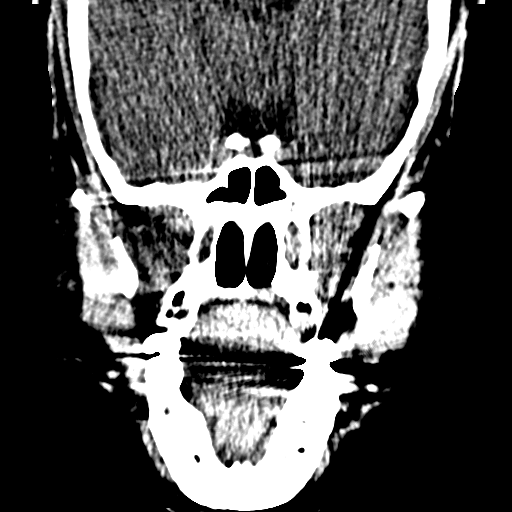
[im 2/18  bone]
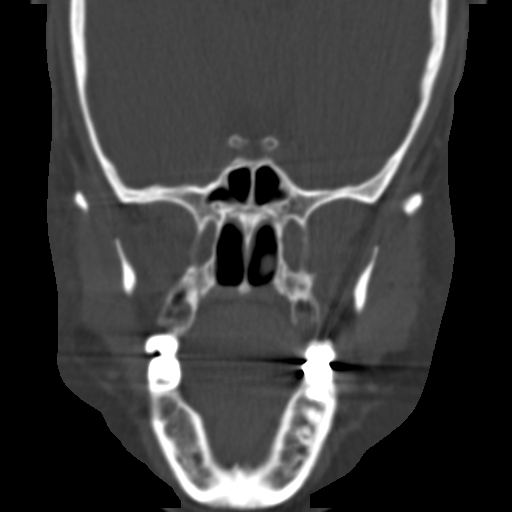
[im 3/18  bone]
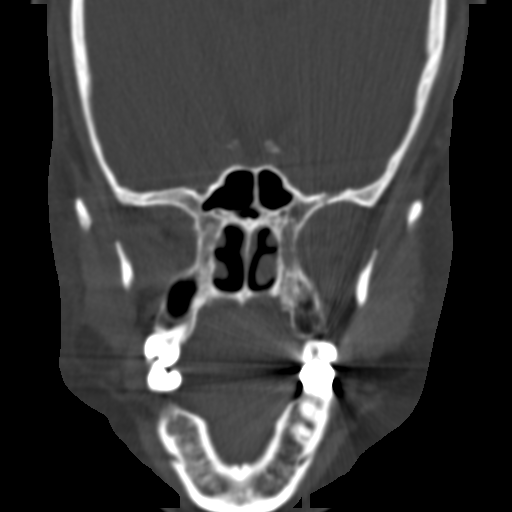
[im 4/18  bone]
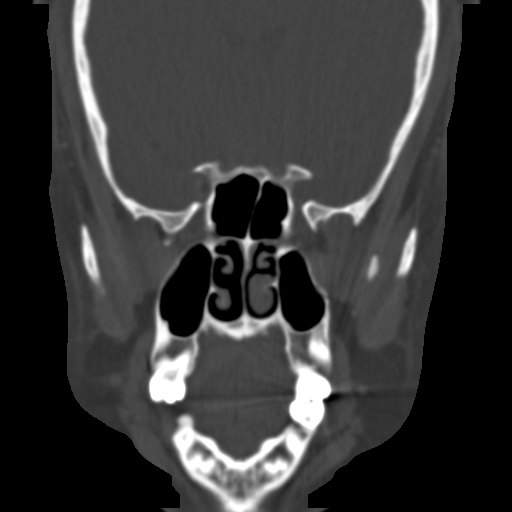
[im 5/18  bone]
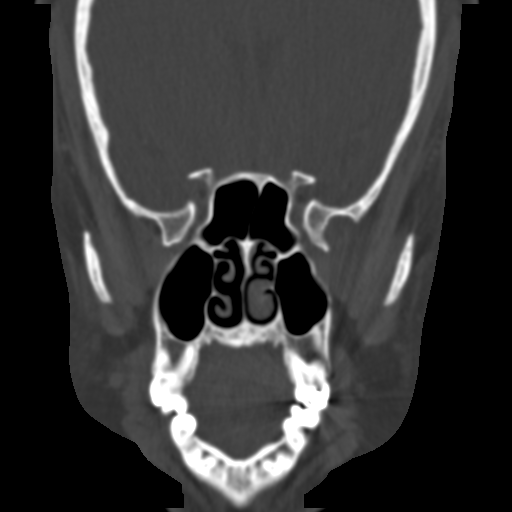
[im 6/18  brain]
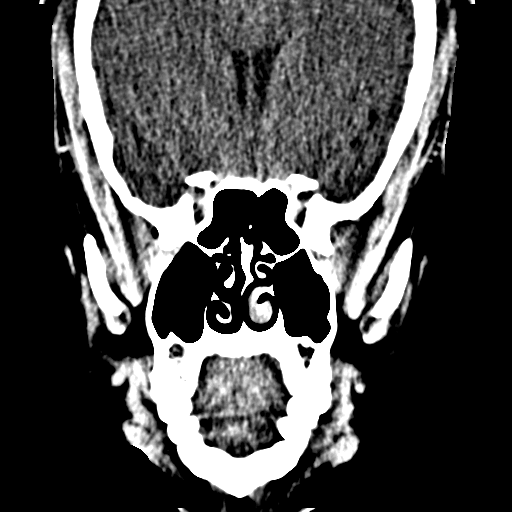
[im 6/18  bone]
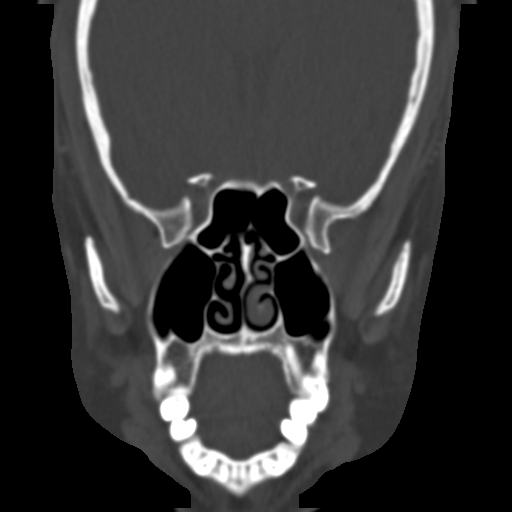
[im 7/18  bone]
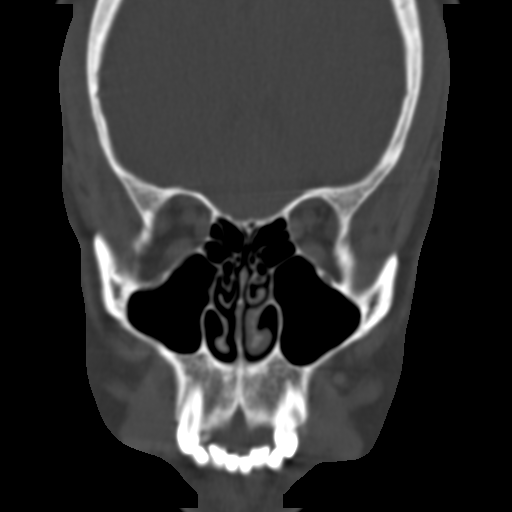
[im 8/18  bone]
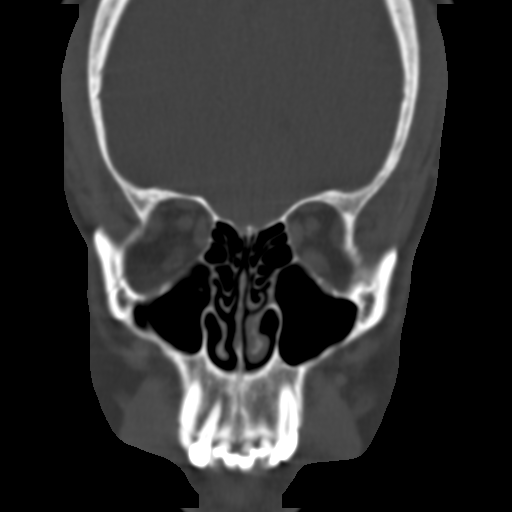
[im 9/18  bone]
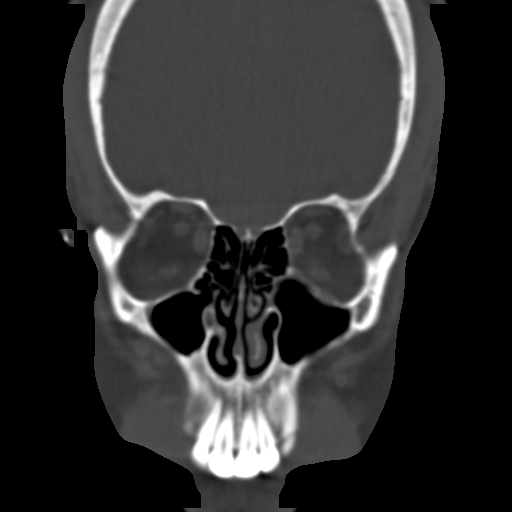
[im 10/18  brain]
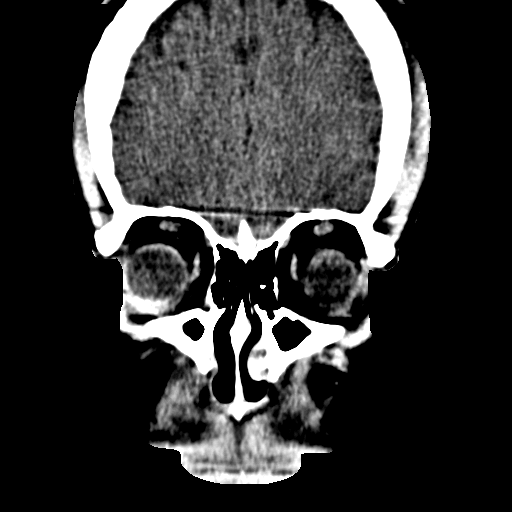
[im 10/18  bone]
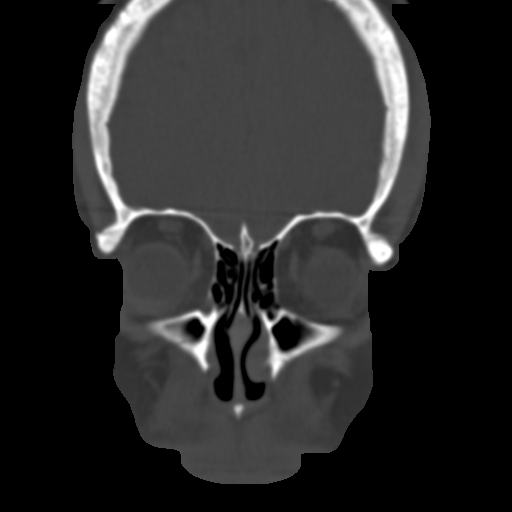
[im 11/18  bone]
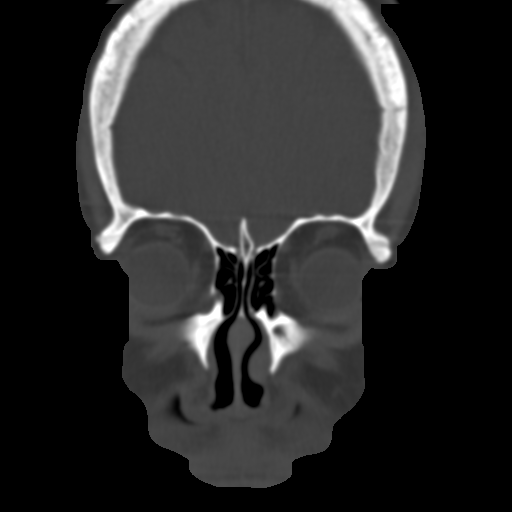
[im 12/18  bone]
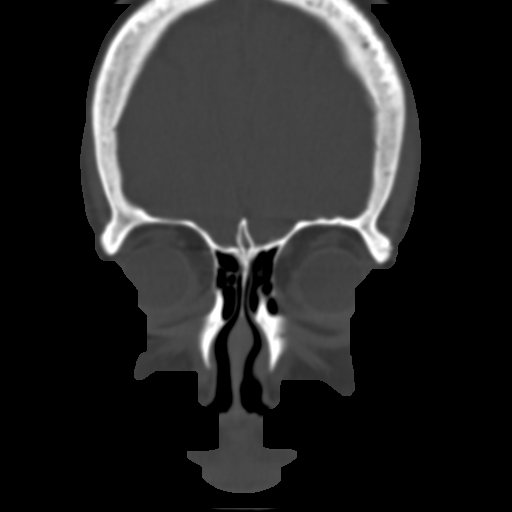
[im 13/18  bone]
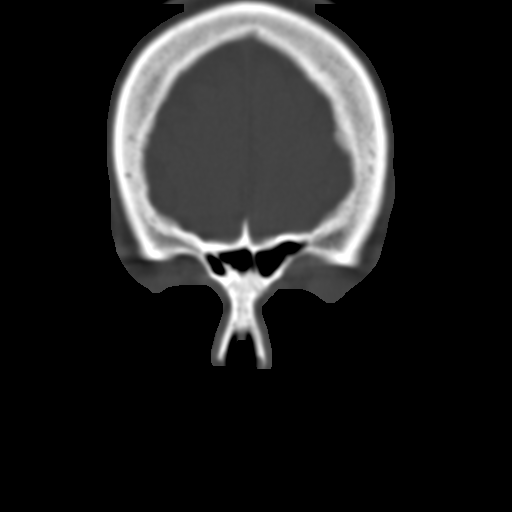
[im 14/18  brain]
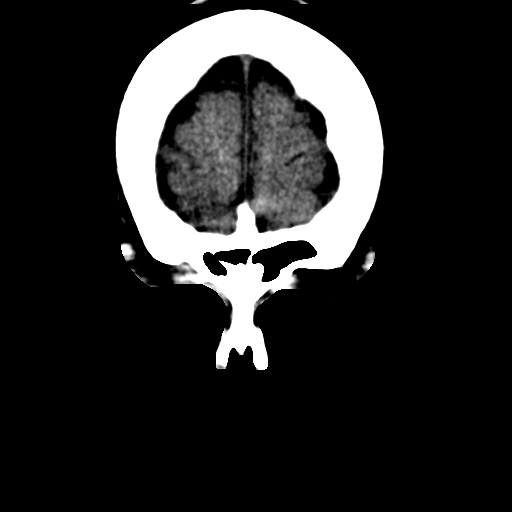
[im 14/18  bone]
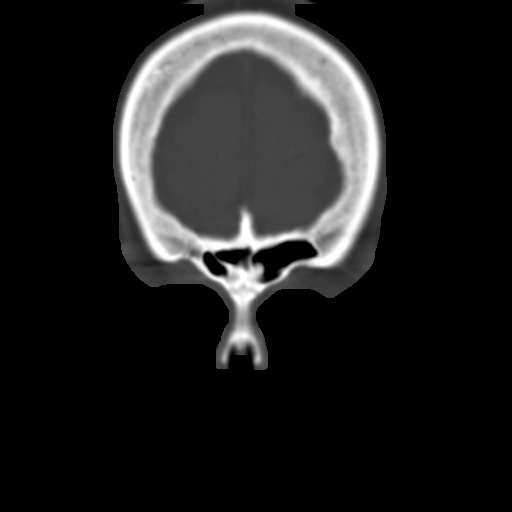
[im 15/18  bone]
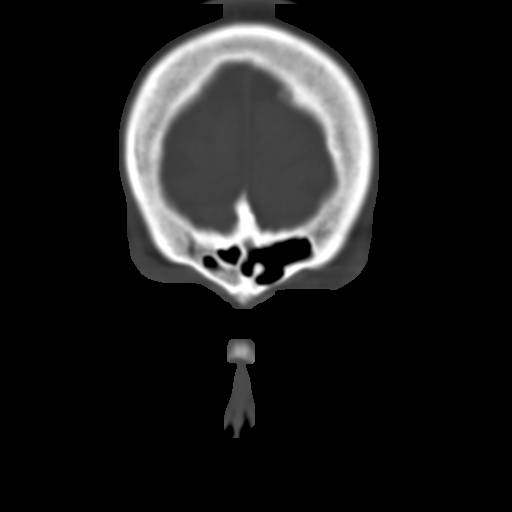
[im 16/18  bone]
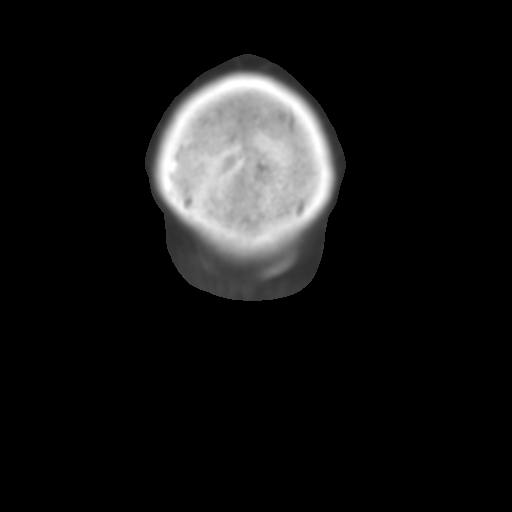
[im 17/18  bone]
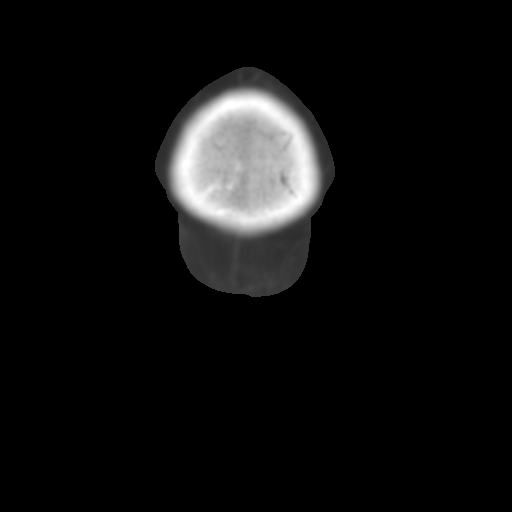

[16 of 19 positions shown; findings below may reference images not displayed]

FINDINGS: Visualized paranasal sinuses are clear.

Visualized intracranial structures and orbital structures
unremarkable.
IMPRESSION: Visualized paranasal sinuses are clear.

## 2015-10-31 ENCOUNTER — Other Ambulatory Visit: Payer: Self-pay | Admitting: Neurosurgery

## 2015-10-31 DIAGNOSIS — E348 Other specified endocrine disorders: Secondary | ICD-10-CM

## 2015-11-12 ENCOUNTER — Ambulatory Visit
Admission: RE | Admit: 2015-11-12 | Discharge: 2015-11-12 | Disposition: A | Discharge status: Home / Self Care | Payer: BLUE CROSS/BLUE SHIELD | Source: Ambulatory Visit | Attending: Neurosurgery | Admitting: Neurosurgery

## 2015-11-12 DIAGNOSIS — E348 Other specified endocrine disorders: Secondary | ICD-10-CM

## 2015-11-12 MED ORDER — GADOBENATE DIMEGLUMINE 529 MG/ML IV SOLN
20.0000 mL | Freq: Once | INTRAVENOUS | Status: AC | PRN
  Administered 2015-11-12: 20 mL via INTRAVENOUS

## 2019-04-07 ENCOUNTER — Other Ambulatory Visit: Payer: Self-pay

## 2019-04-07 DIAGNOSIS — Z20822 Contact with and (suspected) exposure to covid-19: Secondary | ICD-10-CM

## 2019-04-08 LAB — NOVEL CORONAVIRUS, NAA: SARS-CoV-2, NAA: NOT DETECTED

## 2019-11-28 ENCOUNTER — Emergency Department (HOSPITAL_COMMUNITY): Payer: 59

## 2019-11-28 ENCOUNTER — Other Ambulatory Visit: Payer: Self-pay

## 2019-11-28 ENCOUNTER — Encounter (HOSPITAL_COMMUNITY): Payer: Self-pay

## 2019-11-28 ENCOUNTER — Emergency Department (HOSPITAL_COMMUNITY)
Admission: EM | Admit: 2019-11-28 | Discharge: 2019-11-28 | Disposition: A | Discharge status: Home / Self Care | Payer: 59 | Attending: Emergency Medicine | Admitting: Emergency Medicine

## 2019-11-28 DIAGNOSIS — R0602 Shortness of breath: Secondary | ICD-10-CM | POA: Insufficient documentation

## 2019-11-28 DIAGNOSIS — R0789 Other chest pain: Secondary | ICD-10-CM | POA: Insufficient documentation

## 2019-11-28 DIAGNOSIS — R42 Dizziness and giddiness: Secondary | ICD-10-CM | POA: Diagnosis not present

## 2019-11-28 DIAGNOSIS — R202 Paresthesia of skin: Secondary | ICD-10-CM | POA: Insufficient documentation

## 2019-11-28 DIAGNOSIS — J45909 Unspecified asthma, uncomplicated: Secondary | ICD-10-CM | POA: Diagnosis not present

## 2019-11-28 LAB — TROPONIN I (HIGH SENSITIVITY): Troponin I (High Sensitivity): 6 ng/L (ref <18)

## 2019-11-28 LAB — CBC
HCT: 52 % (ref 39.0–52.0)
Hemoglobin: 17.4 g/dL — ABNORMAL HIGH (ref 13.0–17.0)
MCH: 28.9 pg (ref 26.0–34.0)
MCHC: 33.5 g/dL (ref 30.0–36.0)
MCV: 86.2 fL (ref 80.0–100.0)
Platelets: 258 10*3/uL (ref 150–400)
RBC: 6.03 MIL/uL — ABNORMAL HIGH (ref 4.22–5.81)
RDW: 13.9 % (ref 11.5–15.5)
WBC: 7 10*3/uL (ref 4.0–10.5)
nRBC: 0 % (ref 0.0–0.2)

## 2019-11-28 LAB — BASIC METABOLIC PANEL
Anion gap: 6 (ref 5–15)
BUN: 14 mg/dL (ref 6–20)
CO2: 29 mmol/L (ref 22–32)
Calcium: 9.4 mg/dL (ref 8.9–10.3)
Chloride: 107 mmol/L (ref 98–111)
Creatinine, Ser: 1.21 mg/dL (ref 0.61–1.24)
GFR calc Af Amer: >60 mL/min (ref >60)
GFR calc non Af Amer: >60 mL/min (ref >60)
Glucose, Bld: 110 mg/dL — ABNORMAL HIGH (ref 70–99)
Potassium: 4.2 mmol/L (ref 3.5–5.1)
Sodium: 142 mmol/L (ref 135–145)

## 2019-11-28 MED ORDER — IOHEXOL 350 MG/ML SOLN
100.0000 mL | Freq: Once | INTRAVENOUS | Status: AC | PRN
  Administered 2019-11-28: 100 mL via INTRAVENOUS

## 2019-11-28 MED ORDER — SODIUM CHLORIDE 0.9% FLUSH: 3.0000 mL | Freq: Once | INTRAVENOUS | Status: DC

## 2019-11-28 MED ORDER — SODIUM CHLORIDE (PF) 0.9 % IJ SOLN
INTRAMUSCULAR | Status: AC
  Filled 2019-11-28: qty 50

## 2019-11-28 MED ORDER — ALBUTEROL SULFATE HFA 108 (90 BASE) MCG/ACT IN AERS: 1.0000 | INHALATION_SPRAY | Freq: Four times a day (QID) | RESPIRATORY_TRACT | 0 refills | Status: AC | PRN

## 2019-11-28 NOTE — Discharge Instructions (Addendum)
If you develop any new or worsening symptoms please return to the emergency department for reevaluation.  I would recommend reaching out your insurance company and finding a primary care provider soon as possible in the area.  Please continue on your weight loss journey as I believe this will help tremendously with your stress as well as your symptoms.   It was a pleasure to meet you.

## 2019-11-28 NOTE — ED Triage Notes (Signed)
Patient c/o SOB around 0630 today then had mid chest pain and dizziness.

## 2019-11-28 NOTE — ED Provider Notes (Signed)
[facility]-EMERGENCY DEPT Provider Note   CSN: [id number] Arrival date & time: 11/28/19  0815     History Chief Complaint  Patient presents with  . Shortness of Breath  . Chest Pain  . Dizziness    Martin [name] is a 49 y.o. male.  HPI HPI Comments: Martin [name] is a 49 y.o. male with medical history as noted below who presents to the Emergency Department complaining of SOB.  Patient states he woke this morning with some mild shortness of breath.  He states that the feeling was that "air was going into his lungs but his lungs were not opening the way they were supposed to".  He additionally reports constant mild chest pain that starts in his sternum and radiates laterally bilaterally.  He states that his pain was waxing and waning for about 2 to 3 hours and since arrival has mostly alleviated over the last 3 hours.  When his pain was occurring it seemed to worsen with deep breathing. He states he had an episode of paresthesias around his lips when he was short of breath earlier this morning.  He states that he will sometimes take Tylenol PM due to difficulty sleeping and last night did not take this medication and also states that he did not sleep at all last night.  He states that for the last 1 to 2 weeks he has been experiencing worsening stress due to work as well as family.  He denies fevers, chills, URI symptoms, abdominal pain, nausea, vomiting, diarrhea, constipation, urinary changes, syncope.     Past Medical History:  Diagnosis Date  . Abdominal pain   . Allergy   . Anxiety   . Asthma   . Belching   . Bloating   . Chest pain   . Depression   . Diarrhea   . Environmental allergies   . GERD (gastroesophageal reflux disease)   . Heartburn   . Hemorrhoids   . Migraines   . OSA on CPAP     Patient Active Problem List   Diagnosis Date Noted  . GERD (gastroesophageal reflux disease) 12/14/2012  . Cough 11/09/2012  . CAP (community acquired  pneumonia) 11/08/2012  . Sinusitis, acute maxillary 10/17/2012  . Morbid obesity (HCC) 11/14/2011  . Depression with anxiety 03/26/2011  . HYPERLIPIDEMIA 04/30/2007  . OBSTRUCTIVE SLEEP APNEA 04/30/2007  . ALLERGIC RHINITIS 04/30/2007  . HYPERSOMNIA 04/30/2007  . HEADACHE, CHRONIC 04/30/2007  . DYSPNEA 04/30/2007    Past Surgical History:  Procedure Laterality Date  . APPENDECTOMY  1979  . CARDIAC CATHETERIZATION  2006  . CARPAL TUNNEL RELEASE  2008  . ESOPHAGEAL MANOMETRY N/A 01/10/2013   Procedure: ESOPHAGEAL MANOMETRY (EM);  Surgeon: Mardella Layman, MD;  Location: [facility];  Service: Endoscopy;  Laterality: N/A;  . 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 Hypertension Father   . Heart disease Father        pacemaker  . Hypertension Other        All 4 Grandparents  . Arthritis Maternal Grandmother   . Breast cancer Maternal Grandmother   . Lung cancer Paternal Grandmother        was a smoker  . Heart disease Paternal Grandfather   . Stroke Maternal Grandfather   . Asthma Mother     Social History   Tobacco Use  . Smoking status: Never  Smoker  . Smokeless tobacco: Never Used  Vaping Use  . Vaping Use: Never used  Substance Use Topics  . Alcohol use: Yes    Comment: Holidays only  . Drug use: No    Home Medications Prior to Admission medications   Medication Sig Start Date End Date Taking? Authorizing Provider  albuterol (PROVENTIL HFA;VENTOLIN HFA) 108 (90 BASE) MCG/ACT inhaler Inhale 2 puffs into the lungs every 4 (four) hours as needed for wheezing (Use when coughing, short of breath, or wheezing). 11/03/12   Kelle DartingWeaver, Layne C, NP  chlorpheniramine (CHLOR-TRIMETON) 4 MG tablet Take 4 mg by mouth every 6 (six) hours as needed for allergies.    [provider]  citalopram (CELEXA) 40 MG tablet 1 tab by mouth daily--- labs are due now 07/07/13   Zola ButtonLowne Chase, Grayling CongressYvonne R, DO  cyclobenzaprine  (FLEXERIL) 10 MG tablet Take 1 tablet (10 mg total) by mouth 3 (three) times daily as needed for muscle spasms. 11/08/12   Donato SchultzLowne Chase, Yvonne R, DO  esomeprazole (NEXIUM) 40 MG capsule Take 1 capsule (40 mg total) by mouth daily before breakfast. 12/14/12   Nyoka CowdenWert, Michael B, MD  famotidine (PEPCID) 20 MG tablet Take 20 mg by mouth at bedtime.    [provider]  fluticasone (FLONASE) 50 MCG/ACT nasal spray PLACE 2 SPRAYS INTO THE NOSE DAILY. 07/07/13   Sheliah Hatchabori, Katherine E, MD  montelukast (SINGULAIR) 10 MG tablet TAKE 1 TABLET EVERY DAY 02/20/13   Zola ButtonLowne Chase, Grayling CongressYvonne R, DO  Naphazoline-Pheniramine (OPCON-A) 0.027-0.315 % SOLN Apply to eye.    [provider]  sodium chloride (OCEAN) 0.65 % nasal spray Place 1 spray into the nose as needed for congestion.    [provider]  traMADol (ULTRAM) 50 MG tablet TAKE 1-2 EVERY 4 HOURS AS NEEDED FOR COUGH OR PAIN 12/02/12   Nyoka CowdenWert, Michael B, MD    Allergies    Patient has no known allergies.  Review of Systems   Review of Systems  All other systems reviewed and are negative. Ten systems reviewed and are negative for acute change, except as noted in the HPI.  Physical Exam Updated Vital Signs BP (!) 134/91 (BP Location: Left Arm)   Pulse 88   Temp 98.3 F (36.8 C) (Oral)   Resp 20   Ht 5\' 8"  (1.727 m)   Wt (!) 152 kg   SpO2 97%   BMI 50.94 kg/m   Physical Exam Vitals and nursing note reviewed.  Constitutional:      General: He is not in acute distress.    Appearance: Normal appearance. He is well-developed. He is obese. He is not ill-appearing, toxic-appearing or diaphoretic.  HENT:     Head: Normocephalic and atraumatic.     Right Ear: External ear normal.     Left Ear: External ear normal.     Nose: Nose normal.     Mouth/Throat:     Mouth: Mucous membranes are moist.     Pharynx: Oropharynx is clear. No oropharyngeal exudate or posterior oropharyngeal erythema.  Eyes:     Extraocular Movements: Extraocular  movements intact.  Cardiovascular:     Rate and Rhythm: Normal rate and regular rhythm.     Pulses: Normal pulses.     Heart sounds: Normal heart sounds. No murmur heard.  No friction rub. No gallop.   Pulmonary:     Effort: Pulmonary effort is normal. No tachypnea, bradypnea, accessory muscle usage or respiratory distress.     Breath sounds:  Normal breath sounds. No stridor. No decreased breath sounds, wheezing, rhonchi or rales.  Abdominal:     General: Abdomen is flat.     Palpations: Abdomen is soft.     Tenderness: There is no abdominal tenderness.  Musculoskeletal:        General: Normal range of motion.     Cervical back: Normal range of motion and neck supple. No tenderness.     Right lower leg: No tenderness. No edema.     Left lower leg: No tenderness. No edema.     Comments: No calf tenderness.  Skin:    General: Skin is warm and dry.  Neurological:     General: No focal deficit present.     Mental Status: He is alert and oriented to person, place, and time.     Comments: Extraocular movements intact.  Patient is oriented to person, place, time.  Patient is speaking clear in complete sentences.  Negative pronator drift.  Strength is 5 out of 5 in the bilateral upper and lower extremities.  Psychiatric:        Mood and Affect: Mood normal.        Behavior: Behavior normal.    ED Results / Procedures / Treatments   Labs (all labs ordered are listed, but only abnormal results are displayed) Labs Reviewed  BASIC METABOLIC PANEL - Abnormal; Notable for the following components:      Result Value   Glucose, Bld 110 (*)    All other components within normal limits  CBC - Abnormal; Notable for the following components:   RBC 6.03 (*)    Hemoglobin 17.4 (*)    All other components within normal limits  TROPONIN I (HIGH SENSITIVITY)   EKG EKG Interpretation  Date/Time:  Monday November 28 2019 15:07:54 EDT Ventricular Rate:  72 PR Interval:    QRS Duration: 102 QT  Interval:  399 QTC Calculation: 437 R Axis:   10 Text Interpretation: Sinus rhythm Low voltage, extremity leads Confirmed by Gerlene Fee [phone]) on 11/28/2019 3:31:59 PM   Radiology DG Chest 2 View  Result Date: 11/28/2019 CLINICAL DATA:  New onset of chest pain. EXAM: CHEST - 2 VIEW COMPARISON:  11/02/2012 FINDINGS: Mild elevation of the right hemidiaphragm. Streaky densities in the posterior lower chest on the lateral view is nonspecific. Otherwise, the lungs are clear. Heart size is within normal limits and stable. Trachea is midline. No large pleural effusions. Bone structures are unremarkable. IMPRESSION: Streaky densities in the posterior lower chest, probably on the left side. Differential diagnosis would include small focus of infection versus atelectasis. Electronically Signed   By: Markus Daft M.D.   On: 11/28/2019 09:18   CT ANGIO CHEST PE W OR WO CONTRAST  Result Date: 11/28/2019 CLINICAL DATA:  49 year old male with chest pain and shortness of breath. EXAM: CT ANGIOGRAPHY CHEST WITH CONTRAST TECHNIQUE: Multidetector CT imaging of the chest was performed using the standard protocol during bolus administration of intravenous contrast. Multiplanar CT image reconstructions and MIPs were obtained to evaluate the vascular anatomy. CONTRAST:  121mL OMNIPAQUE IOHEXOL 350 MG/ML SOLN COMPARISON:  Chest radiograph dated 11/28/2019 and 11/02/2012. FINDINGS: Cardiovascular: There is no cardiomegaly or pericardial effusion. The thoracic aorta is unremarkable. There is no CT evidence of pulmonary embolism. Mediastinum/Nodes: There is no hilar or mediastinal adenopathy. The esophagus and the thyroid gland are grossly unremarkable. No mediastinal fluid collection. 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 Lungs/Pleura: The lungs are clear. There is no pleural effusion pneumothorax. The central  airways are patent. 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 Upper Abdomen: No acute  abnormality. Musculoskeletal: No chest wall abnormality. No acute or significant osseous findings. Review of the MIP images confirms the above findings. IMPRESSION: No acute intrathoracic pathology. No CT evidence of pulmonary embolism. Electronically Signed   By: Elgie Collard M.D.   On: 11/28/2019 16:38    Procedures Procedures   Medications Ordered in ED Medications  sodium chloride flush (NS) 0.9 % injection 3 mL (has no administration in time range)  sodium chloride (PF) 0.9 % injection (has no administration in time range)  iohexol (OMNIPAQUE) 350 MG/ML injection 100 mL (100 mLs Intravenous Contrast Given 11/28/19 1600)   ED Course  I have reviewed the triage vital signs and the nursing notes.  Pertinent labs & imaging results that were available during my care of the patient were reviewed by me and considered in my medical decision making (see chart for details).    MDM Rules/Calculators/A&P                          Patient is a 49 year old male who presents today due to shortness of breath as well as chest pain.  While in the waiting room his chest pain has mostly resolved.  Patient denies any significant shortness of breath currently.  He is saturating around 98% while I am in the room.  His vital signs are stable.  Labs obtained were generally reassuring.  Slightly elevated RBCs at 6.03 as well as hemoglobin is 17.4.  I discussed this with the patient and he notes poor p.o. intake the last 24 hours.  Very mild hyperglycemia 110.  I discussed this patient with my attending physician Dr. Kennis Carina who also evaluated the patient.  Given his general state of health, finding on CXR above, as well as his symptoms, we decided to obtain a CT angiogram of the chest to rule out PE.  CTA of the chest was negative for any acute abnormalities.  I discussed this with the patient.  His vital signs are stable.  He is afebrile.  Not tachycardic.  He is saturating above 97% while I am in the  room.  Patient does not currently have a PCP.  I recommended that he find a PCP soon as possible.  He states he is going to do so.  He was given an albuterol inhaler for shortness of breath exacerbations.  He was given very strict return precautions and understands he can return to the emergency department with any new or worsening symptoms.  We discussed weight loss and this as well as his recent stress likely having a role in his current symptoms.  His vital signs are stable the time of discharge.  Patient discharged to home/self care.  Condition at discharge: Stable  Note: Portions of this report may have been transcribed using voice recognition software. Every effort was made to ensure accuracy; however, inadvertent computerized transcription errors may be present.    Final Clinical Impression(s) / ED Diagnoses Final diagnoses:  Shortness of breath  Atypical chest pain    Rx / DC Orders ED Discharge Orders         Ordered    albuterol (VENTOLIN HFA) 108 (90 Base) MCG/ACT inhaler  Every 6 hours PRN     Discontinue  Reprint     11/28/19 1707           Placido Sou, PA-C 11/28/19 1943  Sabas Sous, MD 11/29/19 [phone]

## 2020-04-18 ENCOUNTER — Telehealth: Payer: Self-pay | Admitting: Unknown Physician Specialty

## 2020-04-18 ENCOUNTER — Ambulatory Visit (HOSPITAL_COMMUNITY)
Admission: RE | Admit: 2020-04-18 | Discharge: 2020-04-18 | Disposition: A | Discharge status: Home / Self Care | Payer: 59 | Source: Ambulatory Visit | Attending: Pulmonary Disease | Admitting: Pulmonary Disease

## 2020-04-18 ENCOUNTER — Other Ambulatory Visit: Payer: Self-pay | Admitting: Unknown Physician Specialty

## 2020-04-18 DIAGNOSIS — U071 COVID-19: Secondary | ICD-10-CM | POA: Diagnosis present

## 2020-04-18 MED ORDER — EPINEPHRINE 0.3 MG/0.3ML IJ SOAJ: 0.3000 mg | Freq: Once | INTRAMUSCULAR | Status: DC | PRN

## 2020-04-18 MED ORDER — FAMOTIDINE IN NACL 20-0.9 MG/50ML-% IV SOLN: 20.0000 mg | Freq: Once | INTRAVENOUS | Status: DC | PRN

## 2020-04-18 MED ORDER — ALBUTEROL SULFATE HFA 108 (90 BASE) MCG/ACT IN AERS: 2.0000 | INHALATION_SPRAY | Freq: Once | RESPIRATORY_TRACT | Status: DC | PRN

## 2020-04-18 MED ORDER — METHYLPREDNISOLONE SODIUM SUCC 125 MG IJ SOLR: 125.0000 mg | Freq: Once | INTRAMUSCULAR | Status: DC | PRN

## 2020-04-18 MED ORDER — SOTROVIMAB 500 MG/8ML IV SOLN
500.0000 mg | Freq: Once | INTRAVENOUS | Status: AC
  Administered 2020-04-18: 500 mg via INTRAVENOUS

## 2020-04-18 MED ORDER — DIPHENHYDRAMINE HCL 50 MG/ML IJ SOLN: 50.0000 mg | Freq: Once | INTRAMUSCULAR | Status: DC | PRN

## 2020-04-18 MED ORDER — SODIUM CHLORIDE 0.9 % IV SOLN: INTRAVENOUS | Status: DC | PRN

## 2020-04-18 NOTE — Discharge Instructions (Signed)

## 2020-04-18 NOTE — Progress Notes (Signed)
Diagnosis: COVID-19  Physician: Dr. Wright  Procedure: [facility] Med: Sotrovimab infusion - Provided patient with Sotrovimab fact sheet for patients, parents and caregivers prior to infusion.  Complications: No immediate complications noted.  Discharge: Discharged home   Martin [name] Martin [name] 04/18/2020 

## 2020-04-18 NOTE — Telephone Encounter (Signed)
I connected by phone with Martin [name] on 04/18/2020 at 2:24 PM to discuss the potential use of a new treatment for mild to moderate COVID-19 viral infection in non-hospitalized patients.  This patient is a 49 y.o. male that meets the FDA criteria for Emergency Use Authorization of COVID monoclonal antibody casirivimab/imdevimab, bamlanivimab/eteseviamb, or sotrovimab.  Has a (+) direct SARS-CoV-2 viral test result  Has mild or moderate COVID-19   Is NOT hospitalized due to COVID-19  Is within 10 days of symptom onset  Has at least one of the high risk factor(s) for progression to severe COVID-19 and/or hospitalization as defined in EUA.  Specific high risk criteria : BMI > 25   I have spoken and communicated the following to the patient or parent/caregiver regarding COVID monoclonal antibody treatment:  1. FDA has authorized the emergency use for the treatment of mild to moderate COVID-19 in adults and pediatric patients with positive results of direct SARS-CoV-2 viral testing who are 64 years of age and older weighing at least 40 kg, and who are at high risk for progressing to severe COVID-19 and/or hospitalization.  2. The significant known and potential risks and benefits of COVID monoclonal antibody, and the extent to which such potential risks and benefits are unknown.  3. Information on available alternative treatments and the risks and benefits of those alternatives, including clinical trials.  4. Patients treated with COVID monoclonal antibody should continue to self-isolate and use infection control measures (e.g., wear mask, isolate, social distance, avoid sharing personal items, clean and disinfect high touch surfaces, and frequent handwashing) according to CDC guidelines.   5. The patient or parent/caregiver has the option to accept or refuse COVID monoclonal antibody treatment.  After reviewing this information with the patient, the patient has agreed to receive one of  the available covid 19 monoclonal antibodies and will be provided an appropriate fact sheet prior to infusion. Gabriel Cirri, NP 04/18/2020 2:24 PM  Sx onset 11/9

## 2021-01-15 ENCOUNTER — Encounter (HOSPITAL_BASED_OUTPATIENT_CLINIC_OR_DEPARTMENT_OTHER): Payer: Self-pay | Admitting: Family Medicine

## 2021-01-15 ENCOUNTER — Ambulatory Visit (HOSPITAL_BASED_OUTPATIENT_CLINIC_OR_DEPARTMENT_OTHER): Payer: 59 | Admitting: Family Medicine

## 2021-01-15 ENCOUNTER — Other Ambulatory Visit: Payer: Self-pay

## 2021-01-15 VITALS — BP 140/74 | HR 100 | Ht 68.0 in | Wt 346.0 lb

## 2021-01-15 DIAGNOSIS — N200 Calculus of kidney: Secondary | ICD-10-CM | POA: Insufficient documentation

## 2021-01-15 DIAGNOSIS — Z1211 Encounter for screening for malignant neoplasm of colon: Secondary | ICD-10-CM | POA: Diagnosis not present

## 2021-01-15 DIAGNOSIS — Z6841 Body Mass Index (BMI) 40.0 and over, adult: Secondary | ICD-10-CM | POA: Insufficient documentation

## 2021-01-15 DIAGNOSIS — R29898 Other symptoms and signs involving the musculoskeletal system: Secondary | ICD-10-CM | POA: Insufficient documentation

## 2021-01-15 DIAGNOSIS — E348 Other specified endocrine disorders: Secondary | ICD-10-CM

## 2021-01-15 HISTORY — DX: Calculus of kidney: N20.0

## 2021-01-15 NOTE — Assessment & Plan Note (Signed)
History of recurrent stones over the past 10 years Has not had any urologic evaluation/kidney stone composition evaluation Most recent stone passed about a week ago Continue to monitor, discuss possibility of urology referral for stone evaluation at follow-up visit

## 2021-01-15 NOTE — Assessment & Plan Note (Signed)
Noted on prior imaging, most recent study was in 2017 which was an MRI of the brain.  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  There is also partial visualization on a CT scan from 2007 and cyst was felt to be stable compared to that imaging. No concerning clinical features today, no abnormal findings on exam to suggest need for new imaging Discussed management options with patient including continued monitoring clinically versus monitoring with serial imaging.  Patient agreeable to proceed with clinical monitoring, advised on changes to be mindful of including visual changes, changes in headaches.

## 2021-01-15 NOTE — Patient Instructions (Signed)
  Medication Instructions:  Your physician recommends that you continue on your current medications as directed. Please refer to the Current Medication list given to you today. --If you need a refill on any your medications before your next appointment, please call your pharmacy first. If no refills are authorized on file call the office.--  Lab Work: Your physician has recommended that you have lab work today: CBC, CMP, Lipid Profile, HgB A1B, and thyroid Panel If you have labs (blood work) drawn today and your tests are completely normal, you will receive your results via MyChart message OR a phone call from our staff.  Please ensure you check your voicemail in the event that you authorized detailed messages to be left on a delegated number. If you have any lab test that is abnormal or we need to change your treatment, we will call you to review the results.  Referrals/Procedures/Imaging: A referral has been placed for you to [facility] for a Colonoscopy. Someone from the scheduling department will be in contact with you in regards to coordinating your consultation. If you do not hear from any of the schedulers within 7-10 business days please give our office a call.  [facility] [address] [place], [place] [address] Phone: [phone]  Follow-Up: Your next appointment:   Your physician recommends that you schedule a follow-up appointment in: 2-3 MONTHS with Dr. de [place]  Thanks for letting us be apart of your health journey!!  Primary Care and Sports Medicine   Dr. Ceasar Mons [place]   We encourage you to activate your patient portal called "MyChart".  Sign up information is provided on this After Visit Summary.  MyChart is used to connect with patients for Virtual Visits (Telemedicine).  Patients are able to view lab/test results, encounter notes, upcoming appointments, etc.  Non-urgent messages can be sent to your provider as well. To learn more  about what you can do with MyChart, please visit --  [email or web address].

## 2021-01-15 NOTE — Assessment & Plan Note (Signed)
Uncertain etiology, proximal neurologic cause seems less likely given history.  We will proceed initially with laboratory testing as below Continue to monitor symptoms, particular for any changes including change in frequency or additional symptoms or additional areas in the body affected such as upper extremities.

## 2021-01-15 NOTE — Assessment & Plan Note (Signed)
Praised patient for having started workout regimen and joining gym downstairs and encouraged him to continue with this Discussed role of healthy gradual weight loss Will check labs as below

## 2021-01-15 NOTE — Progress Notes (Signed)
New Patient Office Visit  Subjective:  Patient ID: Martin [name], male    DOB: Nov 06, 1970  Age: 50 y.o. MRN: [id number]  CC:  Chief Complaint  Patient presents with   Establish Care   Nephrolithiasis    Patient states he has recurring kidney stones every 8-9 months   Muscle weakness    Patient complains of leg fatigue and muscle weakness intermittently. 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 Patient states he hasn't had any updated imaging in about 5 years.    Colonoscopy    Patient is requesting referral for testing as he is now 64 and feels it is time.     HPI Martin [name] is a 50 year old male presenting to establish in clinic.  He has current concerns today related to bilateral leg weakness, history of pineal cyst, nephrolithiasis.  Leg weakness: Reports that this has been going on for about 2 years, bilateral lower extremities.  Denies any associated pain, numbness or tingling.  Has not had any issues with upper extremities or other areas in the body.  Denies any associated bowel or bladder incontinence.  Has been occurring about every 3 to 4 weeks, most recent episode was about 3 weeks ago.  These episodes will last anywhere from a few hours to a couple days.  Feels as though her legs will give out when episodes occurred.  Uses cane to help with ambulation during these episodes.  Has recently started at the gym downstairs, has not noticed significant change in symptoms since starting.  Nephrolithiasis: Reports first having issues with kidney stones about 10 years ago.  We will passed stone about every 6 to 9 months.  Has not had any stone evaluation or work-up with urology.  Most recent stone was passed  about 1 week ago.  Pineal cyst: Reports an incidental finding of pineal cyst on brain imaging.  Had a follow-up study in 2017 completed with MRI which showed pineal cyst with stable, benign in appearance.  Has not had any further follow-up since then.  Denies recurrent issues with persistent headaches, visual disturbances, extraocular movement deficits.  Sleep apnea: Patient also diagnosed with sleep apnea and has been using CPAP for the past 15 years.  Has had some recent issues with sleep related to nighttime awakenings.  Reports that he will wake up to the bathroom about once per night and has trouble falling back asleep after using the restroom.  Reports that he has had some mood issues in the past and has been prescribed pharmacotherapy as well as undergoing counseling.  Did not like how medications affected him.  Reports that is been a tough year overall given change in job, patient and wife both having coronavirus infection.  Current PHQ-9 and GAD-7 today with total scores of 11 and 5 respectively.  Patient works as an Airline pilot.  He recently started working out at [employer] and has noticed that he has had some weight loss, has needed smaller size for pants.  Past Medical History:  Diagnosis Date   Abdominal pain    Allergy    Anxiety    Asthma  Belching    Bloating    Chest pain    Depression    Diarrhea    Environmental allergies    GERD (gastroesophageal reflux disease)    Heartburn    Hemorrhoids    Migraines    Nephrolithiasis 01/15/2021   OSA on CPAP    Sleep apnea     Past Surgical History:  Procedure Laterality Date   APPENDECTOMY  1979   CARDIAC CATHETERIZATION  2006   CARPAL TUNNEL RELEASE  2008   ESOPHAGEAL MANOMETRY N/A 01/10/2013   Procedure: ESOPHAGEAL MANOMETRY (EM);  Surgeon: Mardella Layman, MD;  Location: [facility];  Service: Endoscopy;  Laterality: N/A;   PILONIDAL CYST EXCISION  1988   pineal cyst      Family History  Problem  Relation Age of Onset   Hypertension Father    Heart disease Father        pacemaker   Hypertension Other        All 4 Grandparents   Arthritis Maternal Grandmother    Breast cancer Maternal Grandmother    Lung cancer Paternal Grandmother        was a smoker   Heart disease Paternal Grandfather    Stroke Maternal Grandfather    Asthma Mother     Social History   Socioeconomic History   Marital status: Married    Spouse name: Not on file   Number of children: 1   Years of education: Not on file   Highest education level: Not on file  Occupational History   Occupation: Airline pilot  Tobacco Use   Smoking status: Never   Smokeless tobacco: Never  Vaping Use   Vaping Use: Never used  Substance and Sexual Activity   Alcohol use: Yes    Comment: 1 drink or less per week   Drug use: No   Sexual activity: Yes    Partners: Female  Other Topics Concern   Not on file  Social History Narrative   Exercise--  Was going regularly---but car died so as soon as car is fixed he will be back   Social Determinants of Corporate investment banker Strain: Not on file  Food Insecurity: Not on file  Transportation Needs: Not on file  Physical Activity: Not on file  Stress: Not on file  Social Connections: Not on file  Intimate Partner Violence: Not on file    Objective:   Today's Vitals: BP 140/74   Pulse 100   Ht 5\' 8"  (1.727 m)   Wt (!) 346 lb (156.9 kg)   SpO2 98%   BMI 52.61 kg/m   Physical Exam  Pleasant 50 year old male in no acute distress Cardiovascular exam with regular rate and rhythm, no murmurs appreciated Lungs clear to auscultation bilaterally  Assessment & Plan:   Problem List Items Addressed This Visit       Endocrine   Pineal gland cyst    Noted on prior imaging, most recent study was in 2017 which was an MRI of the brain.  On this imaging, pineal cyst was measured at 17 mm x 15 mm x 9 mm and was reportedly similar in size compared to MRI completed a  year prior in 2016.  There is also partial visualization on a CT scan from 2007 and cyst was felt to be stable compared to that imaging. No concerning clinical features today, no abnormal findings on exam to suggest need for new imaging Discussed management options with patient including continued monitoring clinically versus  monitoring with serial imaging.  Patient agreeable to proceed with clinical monitoring, advised on changes to be mindful of including visual changes, changes in headaches.         Nervous and Auditory   Leg weakness, bilateral    Uncertain etiology, proximal neurologic cause seems less likely given history.  We will proceed initially with laboratory testing as below Continue to monitor symptoms, particular for any changes including change in frequency or additional symptoms or additional areas in the body affected such as upper extremities.       Relevant Orders   Comprehensive metabolic panel   CBC with Differential/Platelet   TSH Rfx on Abnormal to Free T4     Genitourinary   Nephrolithiasis    History of recurrent stones over the past 10 years Has not had any urologic evaluation/kidney stone composition evaluation Most recent stone passed about a week ago Continue to monitor, discuss possibility of urology referral for stone evaluation at follow-up visit       Relevant Orders   Comprehensive metabolic panel     Other   BMI 50.0-59.9, adult [facility])    Praised patient for having started workout regimen and joining gym downstairs and encouraged him to continue with this Discussed role of healthy gradual weight loss Will check labs as below       Relevant Orders   Comprehensive metabolic panel   Hemoglobin A1c   Lipid panel   Other Visit Diagnoses     Screening for colon cancer    -  Primary   Relevant Orders   Ambulatory referral to Gastroenterology      Given history of mood issues, did discuss availability of counseling with Dr. Bosie Clos in  our office here.  Patient was appreciative of this and will let us know if he would like to proceed with referral.  Outpatient Encounter Medications as of 01/15/2021  Medication Sig   albuterol (VENTOLIN HFA) 108 (90 Base) MCG/ACT inhaler Inhale 1-2 puffs into the lungs every 6 (six) hours as needed for wheezing or shortness of breath.   diphenhydramine-acetaminophen (TYLENOL PM EXTRA STRENGTH) 25-500 MG TABS tablet Take 1 tablet by mouth at bedtime as needed (sleep).   melatonin 5 MG TABS Take 5 mg by mouth at bedtime.   Pseudoephedrine-Ibuprofen (ADVIL COLD/SINUS) 30-200 MG TABS Take 1-2 tablets by mouth daily as needed (allergies).   [DISCONTINUED] citalopram (CELEXA) 40 MG tablet 1 tab by mouth daily--- labs are due now (Patient not taking: Reported on 11/28/2019)   [DISCONTINUED] cyclobenzaprine (FLEXERIL) 10 MG tablet Take 1 tablet (10 mg total) by mouth 3 (three) times daily as needed for muscle spasms. (Patient not taking: Reported on 11/28/2019)   [DISCONTINUED] esomeprazole (NEXIUM) 40 MG capsule Take 1 capsule (40 mg total) by mouth daily before breakfast. (Patient not taking: Reported on 11/28/2019)   [DISCONTINUED] fluticasone (FLONASE) 50 MCG/ACT nasal spray PLACE 2 SPRAYS INTO THE NOSE DAILY. (Patient not taking: Reported on 11/28/2019)   [DISCONTINUED] montelukast (SINGULAIR) 10 MG tablet TAKE 1 TABLET EVERY DAY (Patient not taking: Reported on 11/28/2019)   [DISCONTINUED] traMADol (ULTRAM) 50 MG tablet TAKE 1-2 EVERY 4 HOURS AS NEEDED FOR COUGH OR PAIN (Patient not taking: Reported on 11/28/2019)   No facility-administered encounter medications on file as of 01/15/2021.    Follow-up: Return in about 3 months (around 04/17/2021).   Curry Seefeldt J [place], MD

## 2021-01-16 LAB — COMPREHENSIVE METABOLIC PANEL
ALT: 25 IU/L (ref 0–44)
AST: 22 IU/L (ref 0–40)
Albumin/Globulin Ratio: 1.7 (ref 1.2–2.2)
Albumin: 4.5 g/dL (ref 3.8–4.9)
Alkaline Phosphatase: 77 IU/L (ref 44–121)
BUN/Creatinine Ratio: 11 (ref 9–20)
BUN: 13 mg/dL (ref 6–24)
Bilirubin Total: 0.4 mg/dL (ref 0.0–1.2)
CO2: 23 mmol/L (ref 20–29)
Calcium: 9.4 mg/dL (ref 8.7–10.2)
Chloride: 104 mmol/L (ref 96–106)
Creatinine, Ser: 1.14 mg/dL (ref 0.76–1.27)
Globulin, Total: 2.6 g/dL (ref 1.5–4.5)
Glucose: 92 mg/dL (ref 65–99)
Potassium: 4.1 mmol/L (ref 3.5–5.2)
Sodium: 141 mmol/L (ref 134–144)
Total Protein: 7.1 g/dL (ref 6.0–8.5)
eGFR: 78 mL/min/{1.73_m2} (ref >59)

## 2021-01-16 LAB — HEMOGLOBIN A1C
Est. average glucose Bld gHb Est-mCnc: 105 mg/dL
Hgb A1c MFr Bld: 5.3 % (ref 4.8–5.6)

## 2021-01-16 LAB — CBC WITH DIFFERENTIAL/PLATELET
Basophils Absolute: 0.1 10*3/uL (ref 0.0–0.2)
Basos: 1 %
EOS (ABSOLUTE): 0.2 10*3/uL (ref 0.0–0.4)
Eos: 2 %
Hematocrit: 48.2 % (ref 37.5–51.0)
Hemoglobin: 16.4 g/dL (ref 13.0–17.7)
Immature Grans (Abs): 0 10*3/uL (ref 0.0–0.1)
Immature Granulocytes: 0 %
Lymphocytes Absolute: 1.8 10*3/uL (ref 0.7–3.1)
Lymphs: 25 %
MCH: 28.9 pg (ref 26.6–33.0)
MCHC: 34 g/dL (ref 31.5–35.7)
MCV: 85 fL (ref 79–97)
Monocytes Absolute: 0.4 10*3/uL (ref 0.1–0.9)
Monocytes: 6 %
Neutrophils Absolute: 4.6 10*3/uL (ref 1.4–7.0)
Neutrophils: 66 %
Platelets: 258 10*3/uL (ref 150–450)
RBC: 5.67 x10E6/uL (ref 4.14–5.80)
RDW: 13.8 % (ref 11.6–15.4)
WBC: 7.1 10*3/uL (ref 3.4–10.8)

## 2021-01-16 LAB — LIPID PANEL
Chol/HDL Ratio: 7.8 ratio — ABNORMAL HIGH (ref 0.0–5.0)
Cholesterol, Total: 248 mg/dL — ABNORMAL HIGH (ref 100–199)
HDL: 32 mg/dL — ABNORMAL LOW (ref >39)
LDL Chol Calc (NIH): 166 mg/dL — ABNORMAL HIGH (ref 0–99)
Triglycerides: 263 mg/dL — ABNORMAL HIGH (ref 0–149)
VLDL Cholesterol Cal: 50 mg/dL — ABNORMAL HIGH (ref 5–40)

## 2021-01-16 LAB — TSH RFX ON ABNORMAL TO FREE T4: TSH: 1.8 u[IU]/mL (ref 0.450–4.500)

## 2022-07-24 ENCOUNTER — Ambulatory Visit: Payer: BC Managed Care – PPO | Admitting: Allergy

## 2022-07-24 ENCOUNTER — Encounter: Payer: Self-pay | Admitting: Allergy

## 2022-07-24 ENCOUNTER — Other Ambulatory Visit: Payer: Self-pay

## 2022-07-24 VITALS — BP 120/80 | HR 94 | Temp 98.1°F | Resp 12 | Ht 67.72 in | Wt 319.5 lb

## 2022-07-24 DIAGNOSIS — J4520 Mild intermittent asthma, uncomplicated: Secondary | ICD-10-CM

## 2022-07-24 DIAGNOSIS — J3089 Other allergic rhinitis: Secondary | ICD-10-CM

## 2022-07-24 DIAGNOSIS — L308 Other specified dermatitis: Secondary | ICD-10-CM | POA: Diagnosis not present

## 2022-07-24 MED ORDER — TRIAMCINOLONE ACETONIDE 0.5 % EX OINT: 1.0000 | TOPICAL_OINTMENT | Freq: Two times a day (BID) | CUTANEOUS | 5 refills | Status: AC

## 2022-07-24 MED ORDER — MONTELUKAST SODIUM 10 MG PO TABS: 10.0000 mg | ORAL_TABLET | Freq: Every day | ORAL | 5 refills | Status: DC

## 2022-07-24 MED ORDER — CARBINOXAMINE MALEATE 4 MG PO TABS: 2.0000 | ORAL_TABLET | Freq: Two times a day (BID) | ORAL | 5 refills | Status: AC

## 2022-07-24 MED ORDER — RYALTRIS 665-25 MCG/ACT NA SUSP: 2.0000 | Freq: Two times a day (BID) | NASAL | 5 refills | Status: AC

## 2022-07-24 NOTE — Progress Notes (Signed)
New Patient Note  RE: Martin [name] MRN: [id number] DOB: Nov 13, 1970 Date of Office Visit: 07/24/2022  Primary care provider: Daisy Lazar, PA-C  Chief Complaint: allergies  History of present illness: Martin [name] is a 52 y.o. adult presenting today for evaluation of allergies, rash.  He presents today with his wife.    He states he has a long list of medical problems since July.  He started having left upper abdominal pain and loss of appetite.  He saw GI and had upper and lower endoscopies.  He has had a CT abdomen and found kidney stone, gallbladder stone, 4 hernias, mild splenomegaly.  He saw surgeon for the hernias who states would not surgically repair at this time due to his weight.   In September he started having neuropathy with intense burning pain over most of his body.  He saw nueology for this and is on keppra and cymbalta which has helped.  He had side effects with lyrica and gabapentin with drowsiness which was tried first.     He reports having joint pains.  He states he became more intolerant to extremes of temperature that would worsen this neuropathic pain.   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  He recently had adrenal insufficiency testing which was normal and his thryoid testing was normal with endocrine. He has sleep apnea and needs to see if his settings need to be adjusted with his CPAP.  He states he has plans to see rheumatology. He has an appointment with Dermatology next month.  He states he may see nephrology as well.  He is wondering if he may have multiple different issues going on and not one unifying condition.  He states he had have history of allergies and if he can improve his allergies then that is one less bothersome issue.   He states he has constant congestion, post-nasal drip, sneezing and itching, headache.  He  states symptoms are worse during spring-fall but are year-round.   He has taken zyrtec, claritin, allegra, levocetirizine, flonase, cold&sinus medications.  He states maybe the allergy medications have helped a little bit.  He has not taken anything thus far that has really improved his symptoms.    He has seen Dr Neldon Mc with our practice about 20 years ago. He reports having positive to allergy testing to "things that grow from the ground".  He did not do allergen immunotherapy at that time as he believes there was an insurance issue back then. He would be interested in immunotherapy if insurance covers it now.   He has a history of asthma.  He states he was prescribed albuterol when he had Covid several years ago but states has not had any issues with his asthma in over 15 years.  He does not recall needing to use albuterol even when he had Covid.    He denies eczema history but does report having itching on his abdomen.  He is not sure where the itching is coming from.  He states he has not noted a rash but when it itches he scratches and can break the skin and this causes scabbing.  He has had rash on his arm in the crease recently also not sure what this was from.  His PCP prescribed Triamcinolone that he used and the rash did resolve in a few days.    No  food allergy history.    Review of systems: Review of Systems  Constitutional:        See HPI  HENT:         See HPI  Eyes: Negative.   Respiratory: Negative.    Cardiovascular: Negative.   Gastrointestinal:        See HPI  Musculoskeletal:        See HPI  Skin:        See HPI  Allergic/Immunologic: Negative.   Neurological: Negative.     All other systems negative unless noted above in HPI  Past medical history: Past Medical History:  Diagnosis Date   Abdominal pain    Allergy    Anxiety    Asthma    Belching    Bloating    Chest pain    Depression    Diarrhea    Environmental allergies    GERD (gastroesophageal  reflux disease)    Heartburn    Hemorrhoids    Migraines    Nephrolithiasis 01/15/2021   OSA on CPAP    Sleep apnea     Past surgical history: Past Surgical History:  Procedure Laterality Date   APPENDECTOMY  1979   CARDIAC CATHETERIZATION  2006   CARPAL TUNNEL RELEASE  2008   ESOPHAGEAL MANOMETRY N/A 01/10/2013   Procedure: ESOPHAGEAL MANOMETRY (EM);  Surgeon: Sable Feil, MD;  Location: [facility];  Service: Endoscopy;  Laterality: N/A;   PILONIDAL CYST EXCISION  1988   pineal cyst      Family history:  Family History  Problem Relation Age of Onset   Asthma Mother    Hypertension Father    Heart disease Father        pacemaker   Arthritis Maternal Grandmother    Breast cancer Maternal Grandmother    Stroke Maternal Grandfather    Lung cancer Paternal Grandmother        was a smoker   Heart disease Paternal Grandfather    Hypertension Other        All 4 Grandparents    Social history: Lives in a townhome with carpeting with gas heating and central cooling. No pets in the home. No concern for roaches in the home.  Concern for water damage, mildew in home.  He is a Cytogeneticist.  Denies smoking history.    Medication List: Current Outpatient Medications  Medication Sig Dispense Refill   melatonin 5 MG TABS Take 5 mg by mouth at bedtime.     albuterol (VENTOLIN HFA) 108 (90 Base) MCG/ACT inhaler Inhale 1-2 puffs into the lungs every 6 (six) hours as needed for wheezing or shortness of breath. 8 g 0   cyanocobalamin (VITAMIN B12) 1000 MCG tablet Take 1,000 mcg by mouth daily.     diphenhydramine-acetaminophen (TYLENOL PM EXTRA STRENGTH) 25-500 MG TABS tablet Take 1 tablet by mouth at bedtime as needed (sleep). (Patient not taking: Reported on 07/24/2022)     DULoxetine (CYMBALTA) 60 MG capsule Take 500 mg by mouth 2 (two) times daily.     levETIRAcetam (KEPPRA PO) Take 60 mg by mouth 2 (two) times daily.     Pseudoephedrine-Ibuprofen (ADVIL COLD/SINUS) 30-200 MG  TABS Take 1-2 tablets by mouth daily as needed (allergies).     No current facility-administered medications for this visit.    Known medication allergies: No Known Allergies   Physical examination: Blood pressure 120/80, pulse 94, temperature 98.1 F (36.7 C), temperature source Temporal, resp. rate 12, height 5' 7.72" (  1.72 m), weight (!) 319 lb 8 oz (144.9 kg), SpO2 95 %.  General: Alert, interactive, in no acute distress. HEENT: PERRLA, TMs pearly gray, turbinates moderately edematous without discharge, post-pharynx non erythematous. Neck: Supple without lymphadenopathy. Lungs: Clear to auscultation without wheezing, rhonchi or rales. {no increased work of breathing. CV: Normal S1, S2 without murmurs. Abdomen: Nondistended, nontender. Skin: Abdomen with many hyperpigmented macules some with excoriations . Extremities:  No clubbing, cyanosis or edema. Neuro:   Grossly intact.  Diagnositics/Labs:  Spirometry: FEV1: 2.82L 78%, FVC: 3.52L 77% predicted.  Essentially normal study  Allergy testing:   Skin prick testing was positive to johnson, KB, MF, PR, SV, timothy, birch, beech, hickory, maple, oak, walnut.    Food Perc - 07/24/22 1001       Test Information   Time Antigen Placed 1001    Allergen Manufacturer Lavella Hammock    Location Back    Number of allergen test 10    Food Select      Food   1. Peanut Negative    2. Soybean food Negative    3. Wheat, whole Negative    4. Sesame Negative    5. Milk, cow Negative    6. Egg White, chicken Negative    7. Casein Negative    8. Shellfish mix Negative    9. Fish mix Negative    10. Cashew Negative             Intradermal - 07/24/22 1029     Time Antigen Placed 1029    Allergen Manufacturer Lavella Hammock    Location Arm    Number of Test 11    Intradermal Select    Control Negative    Ragweed mix 2+    Weed mix 3+    Mold 1 Negative    Mold 2 Negative    Mold 3 2+    Mold 4 Negative    Cat Negative    Dog  Negative    Cockroach Negative    Mite mix Negative             Allergy testing results were read and interpreted by provider, documented by clinical staff.   Assessment and plan: Allergic rhinitis Pruritic dermatitis Mild intermittent asthma   - Testing today showed: grasses, ragweed, weeds, trees, and outdoor molds. - Copy of test results provided.  - Avoidance measures provided. - Try the following allergy regimen:  Ryvent (carbinoxamine) 34m twice a day OR generic Carbinoxamine 428m2 tabs twice a day.    Will send in whichever appears to be covered.  Singulair (montelukast) 1039maily.  If you notice any change in mood/behavior/sleep after starting Singulair then stop this medication and let us [place].  Symptoms resolve after stopping the medication.   Ryaltris (olopatadine/mometasone) two sprays per nostril 2 times daily as needed for nasal congestion or drainage.  Sample provided.   - Consider nasal saline rinses 1-2 times daily to remove allergens from the nasal cavities as well as help with mucous clearance (this is especially helpful to do before the nasal sprays are given). - Consider allergy shots as a means of long-term control. - Allergy shots "re-train" and "reset" the immune system to ignore environmental allergens and decrease the resulting immune response to those allergens (sneezing, itchy watery eyes, runny nose, nasal congestion, etc).    - Allergy shots improve symptoms in 75-85% of patients.   - Common food allergy testing is negative.  - Can continue use of Triamcinolone  twice a day as needed for itchy rash.   - Moisturize skin after bathing to ensure not dry as dry skin itches.   - Lung function testing today looks ok. - Have access to albuterol inhaler 2 puffs every 4-6 hours as needed for cough/wheeze/shortness of breath/chest tightness.  May use 15-20 minutes prior to activity.   Monitor frequency of use.    Follow-up in 4-6 months or sooner if  needed  I appreciate the opportunity to take part in Martin [name]'s care. Please do not hesitate to contact me with questions.  Sincerely,   Prudy Feeler, MD Allergy/Immunology [facility]

## 2022-07-24 NOTE — Patient Instructions (Signed)
-   Testing today showed: grasses, ragweed, weeds, trees, and outdoor molds. - Copy of test results provided.  - Avoidance measures provided. - Try the following allergy regimen:  Ryvent (carbinoxamine) 6mg  twice a day OR generic Carbinoxamine 4mg  2 tabs twice a day.    Will send in whichever appears to be covered.  Singulair (montelukast) 10mg  daily.  If you notice any change in mood/behavior/sleep after starting Singulair then stop this medication and let us know.  Symptoms resolve after stopping the medication.   Ryaltris (olopatadine/mometasone) two sprays per nostril 2 times daily as needed for nasal congestion or drainage.  Sample provided.   - Consider nasal saline rinses 1-2 times daily to remove allergens from the nasal cavities as well as help with mucous clearance (this is especially helpful to do before the nasal sprays are given). - Consider allergy shots as a means of long-term control. - Allergy shots "re-train" and "reset" the immune system to ignore environmental allergens and decrease the resulting immune response to those allergens (sneezing, itchy watery eyes, runny nose, nasal congestion, etc).    - Allergy shots improve symptoms in 75-85% of patients.   - Common food allergy testing is negative.  - Can continue use of Triamcinolone twice a day as needed for itchy rash.   - Moisturize skin after bathing to ensure not dry as dry skin itches.   - Lung function testing today looks ok. - Have access to albuterol inhaler 2 puffs every 4-6 hours as needed for cough/wheeze/shortness of breath/chest tightness.  May use 15-20 minutes prior to activity.   Monitor frequency of use.    Follow-up in 4-6 months or sooner if needed

## 2022-12-04 ENCOUNTER — Ambulatory Visit: Payer: BC Managed Care – PPO | Admitting: Allergy

## 2022-12-27 ENCOUNTER — Other Ambulatory Visit: Payer: Self-pay | Admitting: Allergy
